# Patient Record
Sex: Female | Born: 2000 | Race: Black or African American | Hispanic: No | Marital: Single | Smoking: Never smoker
Health system: Southern US, Community
[De-identification: ages and names within clinical notes are randomized; demographics above are authoritative.]

---
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
## 2018-01-01 LAB — URINE CULTURE

## 2018-01-02 ENCOUNTER — Telehealth: Payer: Self-pay | Admitting: Emergency Medicine

## 2018-01-02 NOTE — Telephone Encounter (Signed)
Post ED Visit - Positive Culture Follow-up: Successful Patient Follow-Up  Culture assessed and recommendations reviewed by:  []  Elenor Quinones, Pharm.D. []  Heide Guile, Pharm.D., BCPS AQ-ID []  Parks Neptune, Pharm.D., BCPS []  Alycia Rossetti, Pharm.D., BCPS []  [place], Pharm.D., BCPS, AAHIVP []  Legrand Como, Pharm.D., BCPS, AAHIVP []  Salome Arnt, PharmD, BCPS []  Johnnette Gourd, PharmD, BCPS []  Hughes Better, PharmD, BCPS []  Leeroy Cha, PharmD  Positive urine culture  []  Patient discharged without antimicrobial prescription and treatment is now indicated [x]  Organism is resistant to prescribed ED discharge antimicrobial []  Patient with positive blood cultures  Changes discussed with ED provider: Nuala Alpha PharmD New antibiotic prescription stop cephalexin, start nitrofurantoin 100mg  po bid x 5 days  Attempting to contact patient   Hazle Nordmann 01/02/2018, 11:54 AM

## 2018-01-02 NOTE — Progress Notes (Signed)
ED Antimicrobial Stewardship Positive Culture Follow Up   Linda Johnston is an 17 y.o. female who presented to [facility] on 12/28/2017 with a chief complaint of right-sided abdominal pain, hematuria, and dysuria. Denies fever, chills, nausea, and vomiting.   Chief Complaint  Patient presents with  . Dysuria  . Abdominal Pain    Recent Results (from the past 720 hour(s))  Urine Culture     Status: Abnormal   Collection Time: 12/28/17  6:40 PM  Result Value Ref Range Status   Specimen Description URINE, RANDOM  Final   Special Requests   Final    NONE Performed at [facility], [address] [place], [place] [address]    Culture >=100,000 COLONIES/mL STAPHYLOCOCCUS SAPROPHYTICUS (A)  Final   Report Status 01/01/2018 FINAL  Final   Organism ID, Bacteria STAPHYLOCOCCUS SAPROPHYTICUS (A)  Final      Susceptibility   Staphylococcus saprophyticus - MIC*    CIPROFLOXACIN <=0.5 SENSITIVE Sensitive     GENTAMICIN <=0.5 SENSITIVE Sensitive     NITROFURANTOIN <=16 SENSITIVE Sensitive     OXACILLIN 2 RESISTANT Resistant     TETRACYCLINE <=1 SENSITIVE Sensitive     VANCOMYCIN 1 SENSITIVE Sensitive     TRIMETH/SULFA <=10 SENSITIVE Sensitive     CLINDAMYCIN RESISTANT Resistant     RIFAMPIN <=0.5 SENSITIVE Sensitive     Inducible Clindamycin POSITIVE Resistant     * >=100,000 COLONIES/mL STAPHYLOCOCCUS SAPROPHYTICUS  Wet prep, genital     Status: Abnormal   Collection Time: 12/28/17  6:55 PM  Result Value Ref Range Status   Yeast Wet Prep HPF POC NONE SEEN NONE SEEN Final   Trich, Wet Prep NONE SEEN NONE SEEN Final   Clue Cells Wet Prep HPF POC NONE SEEN NONE SEEN Final   WBC, Wet Prep HPF POC MODERATE (A) NONE SEEN Final   Sperm NONE SEEN  Final    Comment: Performed at [facility], [place] [address], [place], [place] [address]   [x]  Treated with cephalexin 500 mg TID x 7 days, organism resistant to prescribed antimicrobial []  Patient discharged originally without  antimicrobial agent and treatment is now indicated  New antibiotic prescription: nitrofurantoin 100 mg BID x 5 days  ED Provider: Nuala Alpha, PA-C  Ronna Polio 01/02/2018, 10:27 AM Clinical Pharmacist Monday - Friday phone -  [phone] Saturday - Sunday phone - [phone]

## 2018-01-05 ENCOUNTER — Emergency Department (HOSPITAL_COMMUNITY)
Admission: EM | Admit: 2018-01-05 | Discharge: 2018-01-06 | Disposition: A | Discharge status: Home / Self Care | Payer: Medicaid Other | Attending: Emergency Medicine | Admitting: Emergency Medicine

## 2018-01-05 ENCOUNTER — Encounter (HOSPITAL_COMMUNITY): Payer: Self-pay | Admitting: Emergency Medicine

## 2018-01-05 DIAGNOSIS — R309 Painful micturition, unspecified: Secondary | ICD-10-CM | POA: Diagnosis not present

## 2018-01-05 DIAGNOSIS — R350 Frequency of micturition: Secondary | ICD-10-CM | POA: Diagnosis not present

## 2018-01-05 DIAGNOSIS — N3000 Acute cystitis without hematuria: Secondary | ICD-10-CM | POA: Diagnosis not present

## 2018-01-05 DIAGNOSIS — R1030 Lower abdominal pain, unspecified: Secondary | ICD-10-CM | POA: Diagnosis present

## 2018-01-05 LAB — CBC
HEMATOCRIT: 29.5 % — AB (ref 36.0–46.0)
Hemoglobin: 8 g/dL — ABNORMAL LOW (ref 12.0–15.0)
MCH: 16.5 pg — AB (ref 26.0–34.0)
MCHC: 27.1 g/dL — ABNORMAL LOW (ref 30.0–36.0)
MCV: 61 fL — AB (ref 80.0–100.0)
NRBC: 0 % (ref 0.0–0.2)
Platelets: 577 10*3/uL — ABNORMAL HIGH (ref 150–400)
RBC: 4.84 MIL/uL (ref 3.87–5.11)
RDW: 21 % — ABNORMAL HIGH (ref 11.5–15.5)
WBC: 6.5 10*3/uL (ref 4.0–10.5)

## 2018-01-05 LAB — COMPREHENSIVE METABOLIC PANEL
ALBUMIN: 3.5 g/dL (ref 3.5–5.0)
ALK PHOS: 57 U/L (ref 38–126)
ALT: 10 U/L (ref 0–44)
ANION GAP: 8 (ref 5–15)
AST: 18 U/L (ref 15–41)
BUN: 5 mg/dL — ABNORMAL LOW (ref 6–20)
CALCIUM: 9.2 mg/dL (ref 8.9–10.3)
CHLORIDE: 102 mmol/L (ref 98–111)
CO2: 26 mmol/L (ref 22–32)
Creatinine, Ser: 0.88 mg/dL (ref 0.44–1.00)
GFR calc non Af Amer: >60 mL/min (ref >60)
GLUCOSE: 95 mg/dL (ref 70–99)
Potassium: 3.6 mmol/L (ref 3.5–5.1)
SODIUM: 136 mmol/L (ref 135–145)
Total Bilirubin: 0.3 mg/dL (ref 0.3–1.2)
Total Protein: 8.2 g/dL — ABNORMAL HIGH (ref 6.5–8.1)

## 2018-01-05 LAB — URINALYSIS, ROUTINE W REFLEX MICROSCOPIC
BILIRUBIN URINE: NEGATIVE
Glucose, UA: NEGATIVE mg/dL
HGB URINE DIPSTICK: NEGATIVE
Ketones, ur: NEGATIVE mg/dL
Nitrite: NEGATIVE
PH: 5 (ref 5.0–8.0)
Protein, ur: 100 mg/dL — AB
Specific Gravity, Urine: 1.03 (ref 1.005–1.030)

## 2018-01-05 LAB — LIPASE, BLOOD: LIPASE: 42 U/L (ref 11–51)

## 2018-01-05 LAB — I-STAT BETA HCG BLOOD, ED (MC, WL, AP ONLY): I-stat hCG, quantitative: <5 m[IU]/mL (ref <5)

## 2018-01-05 NOTE — ED Triage Notes (Signed)
Pt reports she is being taking the medications she was given for UTI however she continues to have symptoms and pain.  C/O RLQ pain w/ burning w/ urination.  Denies N/V/D/fevers or chills.

## 2018-01-05 NOTE — ED Provider Notes (Signed)
[facility] EMERGENCY DEPARTMENT Provider Note   CSN: [id number] Arrival date & time: 01/05/18  2115     History   Chief Complaint Chief Complaint  Patient presents with  . Urinary Tract Infection    HPI Linda Johnston is a 17 y.o. female.  Patient returns with persistent lower abdominal discomfort and cramping, frequent urination, pain with urination.  Patient was seen a week ago and diagnosed with urinary tract infection.  She has taken the antibiotic but has not helped.  Patient denies fever, nausea, vomiting, back pain.     History reviewed. No pertinent past medical history.  Patient Active Problem List   Diagnosis Date Noted  . Seizure ([place]) 01/06/2013  . Headache 01/06/2013  . Emesis 01/06/2013  . 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 No pertinent surgical history.   OB History   None      Home Medications    Prior to Admission medications   Medication Sig Start Date End Date Taking? Authorizing Provider  cephALEXin (KEFLEX) 500 MG capsule Take 1 capsule (500 mg total) by mouth 3 (three) times daily. 12/28/17   Domenic Moras, PA-C  nitrofurantoin, macrocrystal-monohydrate, (MACROBID) 100 MG capsule Take 1 capsule (100 mg total) by mouth 2 (two) times daily. X 7 days 01/06/18   Orpah Greek, MD  phenazopyridine (PYRIDIUM) 200 MG tablet Take 1 tablet (200 mg total) by mouth 3 (three) times daily as needed for pain. 01/06/18   Orpah Greek, MD  traMADol (ULTRAM) 50 MG tablet Take 1 tablet (50 mg total) by mouth every 6 (six) hours as needed. 01/06/18   Orpah Greek, MD    Family History No family history on file.  Social History Social History   Tobacco Use  . Smoking status: Never Smoker  . Smokeless tobacco: Never Used  . Tobacco comment: Per Guardian-no one smokes around patient  Substance Use Topics  . Alcohol use: No  . Drug use: Not on file     Allergies   Patient has no known  allergies.   Review of Systems Review of Systems  Gastrointestinal: Positive for abdominal pain.  Genitourinary: Positive for dysuria and frequency.  All other systems reviewed and are negative.    Physical Exam Updated Vital Signs BP 91/76   Pulse 62   Temp 98 F (36.7 C) (Oral)   Resp 16   Ht 5\' 9"  (1.753 m)   LMP 12/26/2017 (Exact Date)   SpO2 100%   Physical Exam  Constitutional: She is oriented to person, place, and time. She appears well-developed and well-nourished. No distress.  HENT:  Head: Normocephalic and atraumatic.  Right Ear: Hearing normal.  Left Ear: Hearing normal.  Nose: Nose normal.  Mouth/Throat: Oropharynx is clear and moist and mucous membranes are normal.  Eyes: Pupils are equal, round, and reactive to light. Conjunctivae and EOM are normal.  Neck: Normal range of motion. Neck supple.  Cardiovascular: Regular rhythm, S1 normal and S2 normal. Exam reveals no gallop and no friction rub.  No murmur heard. Pulmonary/Chest: Effort normal and breath sounds normal. No respiratory distress. She exhibits no tenderness.  Abdominal: Soft. Normal appearance and bowel sounds are normal. There is no hepatosplenomegaly. There is tenderness in the suprapubic area. There is no rebound, no guarding, no CVA tenderness, no tenderness at McBurney's point and negative Murphy's sign. No hernia.  Musculoskeletal: Normal range of motion.  Neurological: She is alert and oriented to person, place, and time. She has normal  strength. No cranial nerve deficit or sensory deficit. Coordination normal. GCS eye subscore is 4. GCS verbal subscore is 5. GCS motor subscore is 6.  Skin: Skin is warm, dry and intact. No rash noted. No cyanosis.  Psychiatric: She has a normal mood and affect. Her speech is normal and behavior is normal. Thought content normal.  Nursing note and vitals reviewed.    ED Treatments / Results  Labs (all labs ordered are listed, but only abnormal results are  displayed) Labs Reviewed  COMPREHENSIVE METABOLIC PANEL - Abnormal; Notable for the following components:      Result Value   BUN 5 (*)    Total Protein 8.2 (*)    All other components within normal limits  CBC - Abnormal; Notable for the following components:   Hemoglobin 8.0 (*)    HCT 29.5 (*)    MCV 61.0 (*)    MCH 16.5 (*)    MCHC 27.1 (*)    RDW 21.0 (*)    Platelets 577 (*)    All other components within normal limits  URINALYSIS, ROUTINE W REFLEX MICROSCOPIC - Abnormal; Notable for the following components:   APPearance TURBID (*)    Protein, ur 100 (*)    Leukocytes, UA LARGE (*)    Bacteria, UA RARE (*)    Non Squamous Epithelial 0-5 (*)    All other components within normal limits  LIPASE, BLOOD  I-STAT BETA HCG BLOOD, ED ([facility], [facility], [facility] ONLY)    EKG None  Radiology No results found.  Procedures Procedures (including critical care time)  Medications Ordered in ED Medications  phenazopyridine (PYRIDIUM) tablet 200 mg (has no administration in time range)  nitrofurantoin (macrocrystal-monohydrate) (MACROBID) capsule 100 mg (has no administration in time range)     Initial Impression / Assessment and Plan / ED Course  I have reviewed the triage vital signs and the nursing notes.  Pertinent labs & imaging results that were available during my care of the patient were reviewed by me and considered in my medical decision making (see chart for details).     Patient with persistent urinary tract infection symptoms despite taking Keflex for a week.  Records reviewed, urine culture from 1 week ago was positive for staph saprophyticus.  This was unlikely to respond to the Keflex that was prescribed.  She does not have any signs of pyelonephritis, sepsis or worsening infection.  Lab work is reassuring.  Abdominal exam reveals suprapubic tenderness without guarding or rebound.  Will initiate Macrobid twice daily for 1 week.  Final Clinical Impressions(s) / ED Diagnoses     Final diagnoses:  Acute cystitis without hematuria    ED Discharge Orders         Ordered    nitrofurantoin, macrocrystal-monohydrate, (MACROBID) 100 MG capsule  2 times daily     01/06/18 0006    phenazopyridine (PYRIDIUM) 200 MG tablet  3 times daily PRN     01/06/18 0006    traMADol (ULTRAM) 50 MG tablet  Every 6 hours PRN     01/06/18 0006           Orpah Greek, MD 01/06/18 0006

## 2018-01-06 MED ORDER — PHENAZOPYRIDINE HCL 100 MG PO TABS
200.0000 mg | ORAL_TABLET | Freq: Once | ORAL | Status: AC
  Administered 2018-01-06: 200 mg via ORAL
  Filled 2018-01-06: qty 2

## 2018-01-06 MED ORDER — NITROFURANTOIN MONOHYD MACRO 100 MG PO CAPS: 100.0000 mg | ORAL_CAPSULE | Freq: Two times a day (BID) | ORAL | 0 refills | Status: DC

## 2018-01-06 MED ORDER — NITROFURANTOIN MONOHYD MACRO 100 MG PO CAPS
100.0000 mg | ORAL_CAPSULE | Freq: Once | ORAL | Status: AC
  Administered 2018-01-06: 100 mg via ORAL
  Filled 2018-01-06: qty 1

## 2018-01-06 MED ORDER — TRAMADOL HCL 50 MG PO TABS: 50.0000 mg | ORAL_TABLET | Freq: Four times a day (QID) | ORAL | 0 refills | Status: DC | PRN

## 2018-01-06 MED ORDER — PHENAZOPYRIDINE HCL 200 MG PO TABS: 200.0000 mg | ORAL_TABLET | Freq: Three times a day (TID) | ORAL | 0 refills | Status: DC | PRN

## 2018-01-09 ENCOUNTER — Ambulatory Visit (INDEPENDENT_AMBULATORY_CARE_PROVIDER_SITE_OTHER): Payer: Medicaid Other | Admitting: Family Medicine

## 2018-01-09 ENCOUNTER — Ambulatory Visit: Payer: Medicaid Other | Attending: Family Medicine

## 2018-01-09 ENCOUNTER — Encounter: Payer: Self-pay | Admitting: Family Medicine

## 2018-01-09 VITALS — BP 99/71 | HR 85 | Temp 98.3°F | Resp 17 | Ht 65.0 in | Wt 120.8 lb

## 2018-01-09 DIAGNOSIS — D649 Anemia, unspecified: Secondary | ICD-10-CM

## 2018-01-09 DIAGNOSIS — N926 Irregular menstruation, unspecified: Secondary | ICD-10-CM

## 2018-01-09 DIAGNOSIS — N390 Urinary tract infection, site not specified: Secondary | ICD-10-CM | POA: Insufficient documentation

## 2018-01-09 NOTE — Progress Notes (Signed)
Linda Johnston, is a 17 y.o. female  DJS:[id number]  YIF:[id number]  DOB - 12-22-2000  CC: No chief complaint on file.      HPI: Linda Johnston is a 17 y.o. female is here today to establish care.   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   Today's visit: ER follow-up UTI  Linda Johnston was seen at [facility] ER initially on 12/28/2017 and diagnosed and treated for pyelonephritis Keflex. She represented the ER on 01/05/2018 with persistent unimproved urinary symptoms and suprapubic tenderness. Urine culture revealed UTI secondary to staph saprophytius. Keflex was discontinued and Macrobid was initiated along with pain medication. CBC significant for anemia with a hemoglobin 8 and thrombocytosis (plt 577). STD test performed in ER were negative. She was referred her to establish care today. Symptoms of UTI have now resolved.  Denies fever, chills, abdominal pain, flank pain, nausea or vomiting.  Anemia No known history of anemia. She reports her last menstrual period came 2 weeks after she had completed the previous cycles. Menstrual cycle was very heavy, experienced cramps, with N&V. Reports her normal cycle typically doesn't cause illness. No history of sickle cell anemia., fibroids, or irregular periods. Has been sexually active, although not recently. Recent urine pregnancy was negative. Denies new headaches, chest pain,  numbness or tingling, bruising or SOB.  Current medications: Current Outpatient Medications:  .  nitrofurantoin, macrocrystal-monohydrate, (MACROBID) 100 MG capsule, Take 1 capsule (100 mg total) by mouth 2 (two) times daily. X 7 days, Disp: 14 capsule, Rfl: 0 .  phenazopyridine (PYRIDIUM) 200 MG tablet, Take 1 tablet (200 mg total) by mouth 3 (three) times daily as needed for pain., Disp: 6 tablet, Rfl: 0 .  traMADol (ULTRAM) 50 MG tablet, Take 1 tablet (50 mg total) by mouth every 6 (six) hours as needed., Disp: 15  tablet, Rfl: 0   Pertinent family medical history: No known family of cardiovascular disease, diabetes, blood disorders, or lung disease.  No Known Allergies  Social History   Socioeconomic History  . Marital status: Single    Spouse name: Not on file  . Number of children: Not on file  . Years of education: Not on file  . Highest education level: Not on file  Occupational History  . Not on file  Social Needs  . Financial resource strain: Not on file  . Food insecurity:    Worry: Not on file    Inability: Not on file  . Transportation needs:    Medical: Not on file    Non-medical: Not on file  Tobacco Use  . Smoking status: Never Smoker  . Smokeless tobacco: Never Used  . Tobacco comment: Per Guardian-no one smokes around patient  Substance and Sexual Activity  . Alcohol use: No  . Drug use: Not on file  . Sexual activity: Never  Lifestyle  . Physical activity:    Days per week: Not on file    Minutes per session: Not on file  . Stress: Not on file  Relationships  . Social connections:    Talks on phone: Not on file    Gets together: Not on file    Attends religious service: Not on file    Active member of club or organization: Not on file    Attends meetings of clubs or organizations: Not on file    Relationship status: Not on file  . Intimate partner violence:    Fear of current or ex partner: Not on file  Emotionally abused: Not on file    Physically abused: Not on file    Forced sexual activity: Not on file  Other Topics Concern  . Not on file  Social History Narrative   Pt from [place]. Living in [place] with [place] to attend school here. They are unaware of medical history or family history.     Review of Systems: Constitutional: Negative for fever, chills, diaphoresis, activity change, appetite change and fatigue. Respiratory: Negative for cough, choking, chest tightness, shortness of breath, wheezing and stridor.  Cardiovascular: Negative for  chest pain, palpitations and leg swelling. Gastrointestinal: Negative for abdominal distention. Genitourinary: Negative for dysuria, urgency, frequency, hematuria, flank pain, decreased urine volume, difficulty urinating. Neurological: Negative for dizziness, tremors, seizures, syncope, facial asymmetry, speech difficulty, weakness, light-headedness, numbness and headaches.  Hematological: Negative for adenopathy. Does not bruise/bleed easily. Psychiatric/Behavioral: Negative for hallucinations, behavioral problems, confusion, dysphoric mood, decreased concentration and agitation. Objective:   Vitals:   01/09/18 0930  BP: 99/71  Pulse: 85  Resp: 17  Temp: 98.3 F (36.8 C)  SpO2: 99%    BP Readings from Last 3 Encounters:  01/06/18 91/76  12/28/17 96/67  04/03/16 109/65    Filed Weights   01/09/18 0930  Weight: 120 lb 12.8 oz (54.8 kg)      Physical Exam: Constitutional: Patient appears well-developed and well-nourished. No distress. Neck: Normal ROM. Neck supple. No JVD. No tracheal deviation. No thyromegaly. CVS: RRR, S1/S2 +, no murmurs, no gallops, no carotid bruit.  Pulmonary: Effort and breath sounds normal, no stridor, rhonchi, wheezes, rales.  Abdominal: Soft. BS +, no distension, tenderness, rebound or guarding. No CVA tenderness. Musculoskeletal: Normal range of motion. No edema and no tenderness.  Neuro: Alert. Normal muscle tone coordination. Normal gait. No cranial nerve deficit. Skin: Skin is warm and dry. No rash noted. Not diaphoretic. No erythema. No pallor. Psychiatric: Normal mood and affect. Behavior, judgment, thought content normal.  Lab Results (prior encounters)  Lab Results  Component Value Date   WBC 6.5 01/05/2018   HGB 8.0 (L) 01/05/2018   HCT 29.5 (L) 01/05/2018   MCV 61.0 (L) 01/05/2018   PLT 577 (H) 01/05/2018   Lab Results  Component Value Date   CREATININE 0.88 01/05/2018   BUN 5 (L) 01/05/2018   NA 136 01/05/2018   K 3.6  01/05/2018   CL 102 01/05/2018   CO2 26 01/05/2018  2     Assessment and plan:  1. Irregular menstrual cycle, will continue to monitor for now as she has only experienced one episode of abnormal bleeding. Will check HCG quantitative to rule out possible recent hx of pregnancy give the associated symptoms of N&V and the irregularity of the bleeding.  2. Anemia, unspecified type, recent hemoglobin 8. One episode of heavy bleeding with an abnormally early, heavy,  menstrual cycle. Checking  Iron, TIBC and Ferritin Panel,  CBC with Differential, Beta hCG quant (ref lab); Future  3. Acute UTI (urinary tract infection) - Urine Culture, to ensure bacteria is not continuing to grow in urine. Continue and complete all antibiotics   Return in about 6 weeks (around 02/20/2018) for Anemia .  Orders Placed This Encounter  Procedures  . Urine Culture    Standing Status:   Future    Number of Occurrences:   1    Standing Expiration Date:   01/10/2019  . Iron, TIBC and Ferritin Panel    Standing Status:   Future    Number of Occurrences:  1    Standing Expiration Date:   01/10/2019  . CBC with Differential    Standing Status:   Future    Number of Occurrences:   1    Standing Expiration Date:   01/10/2019  . Beta hCG quant (ref lab)    Standing Status:   Future    Number of Occurrences:   1    Standing Expiration Date:   01/10/2019    The patient was given clear instructions to go to ER or return to medical center if symptoms don't improve, worsen or new problems develop. The patient verbalized understanding. The patient was advised  to call and obtain lab results if they haven't heard anything from out office within 7-10 business days.  Molli Barrows, FNP Primary Care at [facility] [address], [place] [phone]: [phone]   A total of 30 minutes spent, greater than 50 % of this time was spent counseling and coordination of care.    This  note has been created with Surveyor, quantity. Any transcriptional errors are unintentional.

## 2018-01-09 NOTE — Patient Instructions (Addendum)
To have your labs completed go to: [facility], [place], [place] [address] [phone]   Thank you for choosing Primary Care at [facility] for your medical home!    Linda Johnston was seen by Molli Barrows, FNP today.   Linda Johnston's primary care provider is Scot Jun, FNP.   For the best care possible,  you should try to see Molli Barrows, FNP henever you come to clinic.   We look forward to seeing you again soon!  If you have any questions about your visit today,  please call us at   Or feel free to reach your provider via [place].      Iron Deficiency Anemia, Adult Iron-deficiency anemia is when you have a low amount of red blood cells or hemoglobin. This happens because you have too little iron in your body. Hemoglobin carries oxygen to parts of the body. Anemia can cause your body to not get enough oxygen. It may or may not cause symptoms. Follow these instructions at home: Medicines  Take over-the-counter and prescription medicines only as told by your doctor. This includes iron pills (supplements) and vitamins.  If you cannot handle taking iron pills by mouth, ask your doctor about getting iron through: ? A vein (intravenously). ? A shot (injection) into a muscle.  Take iron pills when your stomach is empty. If you cannot handle this, take them with food.  Do not drink milk or take antacids at the same time as your iron pills.  To prevent trouble pooping (constipation), eat fiber or take medicine (stool softener) as told by your doctor. Eating and drinking  Talk with your doctor before changing the foods you eat. He or she may tell you to eat foods that have a lot of iron, such as: ? Liver. ? Lowfat (lean) beef. ? Breads and cereals that have iron added to them (fortified breads and cereals). ? Eggs. ? Dried fruit. ? Dark green, leafy vegetables.  Drink enough fluid to keep your pee (urine) clear or  pale yellow.  Eat fresh fruits and vegetables that are high in vitamin C. They help your body to use iron. Foods with a lot of vitamin C include: ? Oranges. ? Peppers. ? Tomatoes. ? Mangoes. General instructions  Return to your normal activities as told by your doctor. Ask your doctor what activities are safe for you.  Keep yourself clean, and keep things clean around you (your surroundings). Anemia can make you get sick more easily.  Keep all follow-up visits as told by your doctor. This is important. Contact a doctor if:  You feel sick to your stomach (nauseous).  You throw up (vomit).  You feel weak.  You are sweating for no clear reason.  You have trouble pooping, such as: ? Pooping (having a bowel movement) less than 3 times a week. ? Straining to poop. ? Having poop that is hard, dry, or larger than normal. ? Feeling full or bloated. ? Pain in the lower belly. ? Not feeling better after pooping. Get help right away if:  You pass out (faint). If this happens, do not drive yourself to the hospital. Call your local emergency services (911 in the [place]  You have chest pain.  You have shortness of breath that: ? Is very bad. ? Gets worse with physical activity.  You have a fast heartbeat.  You get light-headed when getting up from sitting or lying down. This information is not intended  to replace advice given to you by your health care provider. Make sure you discuss any questions you have with your health care provider. Document Released: 04/16/2010 Document Revised: 12/02/2015 Document Reviewed: 12/02/2015 Elsevier Interactive Patient Education  2017 Elsevier Inc.  Iron-Rich Diet Iron is a mineral that helps your body to produce hemoglobin. Hemoglobin is a protein in your red blood cells that carries oxygen to your body's tissues. Eating too little iron may cause you to feel weak and tired, and it can increase your risk for infection. Eating enough iron is  necessary for your body's metabolism, muscle function, and nervous system. Iron is naturally found in many foods. It can also be added to foods or fortified in foods. There are two types of dietary iron:  Heme iron. Heme iron is absorbed by the body more easily than nonheme iron. Heme iron is found in meat, poultry, and fish.  Nonheme iron. Nonheme iron is found in dietary supplements, iron-fortified grains, beans, and vegetables.  You may need to follow an iron-rich diet if:  You have been diagnosed with iron deficiency or iron-deficiency anemia.  You have a condition that prevents you from absorbing dietary iron, such as: ? Infection in your intestines. ? Celiac disease. This involves long-lasting (chronic) inflammation of your intestines.  You do not eat enough iron.  You eat a diet that is high in foods that impair iron absorption.  You have lost a lot of blood.  You have heavy bleeding during your menstrual cycle.  You are pregnant.  What is my plan? Your health care provider may help you to determine how much iron you need per day based on your condition. Generally, when a person consumes sufficient amounts of iron in the diet, the following iron needs are met:  Men. ? 50-55 years old: 11 mg per day. ? 51-74 years old: 8 mg per day.  Women. ? 12-73 years old: 15 mg per day. ? 56-48 years old: 18 mg per day. ? Over 80 years old: 8 mg per day. ? Pregnant women: 27 mg per day. ? Breastfeeding women: 9 mg per day.  What do I need to know about an iron-rich diet?  Eat fresh fruits and vegetables that are high in vitamin C along with foods that are high in iron. This will help increase the amount of iron that your body absorbs from food, especially with foods containing nonheme iron. Foods that are high in vitamin C include oranges, peppers, tomatoes, and mango.  Take iron supplements only as directed by your health care provider. Overdose of iron can be life-threatening.  If you were prescribed iron supplements, take them with orange juice or a vitamin C supplement.  Cook foods in pots and pans that are made from iron.  Eat nonheme iron-containing foods alongside foods that are high in heme iron. This helps to improve your iron absorption.  Certain foods and drinks contain compounds that impair iron absorption. Avoid eating these foods in the same meal as iron-rich foods or with iron supplements. These include: ? Coffee, black tea, and red wine. ? Milk, dairy products, and foods that are high in calcium. ? Beans, soybeans, and peas. ? Whole grains.  When eating foods that contain both nonheme iron and compounds that impair iron absorption, follow these tips to absorb iron better. ? Soak beans overnight before cooking. ? Soak whole grains overnight and drain them before using. ? Ferment flours before baking, such as using yeast in bread dough.  What foods can I eat? Grains Iron-fortified breakfast cereal. Iron-fortified whole-wheat bread. Enriched rice. Sprouted grains. Vegetables Spinach. Potatoes with skin. Green peas. Broccoli. Red and green bell peppers. Fermented vegetables. Fruits Prunes. Raisins. Oranges. Strawberries. Mango. Grapefruit. Meats and Other Protein Sources Beef liver. Oysters. Beef. Shrimp. [place]. Chicken. [place]. Sardines. Chickpeas. Nuts. Tofu. Beverages Tomato juice. Fresh orange juice. Prune juice. Hibiscus tea. Fortified instant breakfast shakes. Condiments Tahini. Fermented soy sauce. Sweets and Desserts Black-strap molasses. Other Wheat germ. The items listed above may not be a complete list of recommended foods or beverages. Contact your dietitian for more options. What foods are not recommended? Grains Whole grains. Bran cereal. Bran flour. Oats. Vegetables Artichokes. Brussels sprouts. Kale. Fruits Blueberries. Raspberries. Strawberries. Figs. Meats and Other Protein Sources Soybeans. Products made from soy  protein. Dairy Milk. Cream. Cheese. Yogurt. Cottage cheese. Beverages Coffee. Black tea. Red wine. Sweets and Desserts Cocoa. Chocolate. Ice cream. Other Basil. Oregano. Parsley. The items listed above may not be a complete list of foods and beverages to avoid. Contact your dietitian for more information. This information is not intended to replace advice given to you by your health care provider. Make sure you discuss any questions you have with your health care provider. Document Released: 10/26/2004 Document Revised: 10/02/2015 Document Reviewed: 10/09/2013 Elsevier Interactive Patient Education  [employer].

## 2018-01-10 ENCOUNTER — Telehealth: Payer: Self-pay | Admitting: Family Medicine

## 2018-01-10 ENCOUNTER — Other Ambulatory Visit (HOSPITAL_COMMUNITY): Payer: Self-pay | Admitting: Family Medicine

## 2018-01-10 DIAGNOSIS — D75839 Thrombocytosis, unspecified: Secondary | ICD-10-CM

## 2018-01-10 DIAGNOSIS — D473 Essential (hemorrhagic) thrombocythemia: Secondary | ICD-10-CM

## 2018-01-10 DIAGNOSIS — D649 Anemia, unspecified: Secondary | ICD-10-CM

## 2018-01-10 DIAGNOSIS — D509 Iron deficiency anemia, unspecified: Secondary | ICD-10-CM

## 2018-01-10 LAB — CBC WITH DIFFERENTIAL/PLATELET
BASOS ABS: 0 10*3/uL (ref 0.0–0.2)
BASOS: 1 %
EOS (ABSOLUTE): 0.1 10*3/uL (ref 0.0–0.4)
Eos: 2 %
HEMOGLOBIN: 8.4 g/dL — AB (ref 11.1–15.9)
Hematocrit: 31.2 % — ABNORMAL LOW (ref 34.0–46.6)
Immature Grans (Abs): 0 10*3/uL (ref 0.0–0.1)
Immature Granulocytes: 0 %
LYMPHS ABS: 1.4 10*3/uL (ref 0.7–3.1)
Lymphs: 27 %
MCH: 16.7 pg — AB (ref 26.6–33.0)
MCHC: 26.9 g/dL — AB (ref 31.5–35.7)
MCV: 62 fL — AB (ref 79–97)
MONOCYTES: 9 %
Monocytes Absolute: 0.5 10*3/uL (ref 0.1–0.9)
NEUTROS ABS: 3.2 10*3/uL (ref 1.4–7.0)
Neutrophils: 61 %
Platelets: 623 10*3/uL — ABNORMAL HIGH (ref 150–450)
RBC: 5.03 x10E6/uL (ref 3.77–5.28)
RDW: 20.6 % — ABNORMAL HIGH (ref 12.3–15.4)
WBC: 5.1 10*3/uL (ref 3.4–10.8)

## 2018-01-10 LAB — IRON,TIBC AND FERRITIN PANEL
FERRITIN: 10 ng/mL — AB (ref 15–77)
IRON SATURATION: 5 % — AB (ref 15–55)
Iron: 19 ug/dL — ABNORMAL LOW (ref 27–159)
Total Iron Binding Capacity: 402 ug/dL (ref 250–450)
UIBC: 383 ug/dL (ref 131–425)

## 2018-01-10 LAB — BETA HCG QUANT (REF LAB): hCG Quant: <1 m[IU]/mL

## 2018-01-10 NOTE — Telephone Encounter (Signed)
Contacted patient regarding labs. Hemoglobin and iron level remains low. Will schedule an iron infusion of Ferriheme in efforts to improve her iron level.  Given patient low hemoglobin and abnormally elevated platelet count, without any specific known source of bleeding,  will refer to hematology. For further work-up and evaluation.   Scheduled patient for an iron infusion with ferriheme 01/11/2018 @ 9 am at the [facility]. Patient notified via phone.

## 2018-01-10 NOTE — Progress Notes (Signed)
Orders placed for feraheme infusion

## 2018-01-10 NOTE — Addendum Note (Signed)
Addended by: Scot Jun on: 01/10/2018 12:01 PM   Modules accepted: Orders

## 2018-01-11 ENCOUNTER — Ambulatory Visit (INDEPENDENT_AMBULATORY_CARE_PROVIDER_SITE_OTHER): Payer: Medicaid Other | Admitting: Family Medicine

## 2018-01-11 ENCOUNTER — Ambulatory Visit (HOSPITAL_COMMUNITY)
Admission: RE | Admit: 2018-01-11 | Discharge: 2018-01-11 | Disposition: A | Discharge status: Home / Self Care | Payer: Medicaid Other | Source: Ambulatory Visit | Attending: Family Medicine | Admitting: Family Medicine

## 2018-01-11 VITALS — BP 97/61 | HR 95 | Temp 98.1°F | Resp 16 | Ht 65.0 in | Wt 120.8 lb

## 2018-01-11 DIAGNOSIS — Z30013 Encounter for initial prescription of injectable contraceptive: Secondary | ICD-10-CM | POA: Diagnosis not present

## 2018-01-11 DIAGNOSIS — D508 Other iron deficiency anemias: Secondary | ICD-10-CM | POA: Diagnosis not present

## 2018-01-11 DIAGNOSIS — D509 Iron deficiency anemia, unspecified: Secondary | ICD-10-CM | POA: Diagnosis present

## 2018-01-11 DIAGNOSIS — D649 Anemia, unspecified: Secondary | ICD-10-CM

## 2018-01-11 LAB — URINE CULTURE

## 2018-01-11 MED ORDER — SODIUM CHLORIDE 0.9 % IV SOLN
510.0000 mg | Freq: Once | INTRAVENOUS | Status: AC
  Administered 2018-01-11: 510 mg via INTRAVENOUS
  Filled 2018-01-11: qty 17

## 2018-01-11 MED ORDER — MEDROXYPROGESTERONE ACETATE 150 MG/ML IM SUSP
150.0000 mg | Freq: Once | INTRAMUSCULAR | Status: AC
  Administered 2018-01-11: 150 mg via INTRAMUSCULAR

## 2018-01-11 MED ORDER — SODIUM CHLORIDE 0.9 % IV SOLN
INTRAVENOUS | Status: DC | PRN
  Administered 2018-01-11: 250 mL via INTRAVENOUS

## 2018-01-11 MED ORDER — SODIUM CHLORIDE 0.9 % IV SOLN: INTRAVENOUS | Status: DC | PRN

## 2018-01-11 NOTE — Progress Notes (Signed)
PATIENT CARE CENTER NOTE  Diagnosis: Iron Deficiency Anemia    Provider: Molli Barrows, FNP   Procedure: IV Feraheme    Note: Patient received infusion of Feraheme. Monitored patient for 30 minutes post-infusion. Patient tolerated infusion well with no adverse reaction. Vital signs stable. Discharge instructions given. Patient alert, oriented and ambulatory at discharge.

## 2018-01-11 NOTE — Discharge Instructions (Signed)

## 2018-01-11 NOTE — Progress Notes (Signed)
Patient ID: Linda Johnston, female    DOB: 15-Sep-2000, 17 y.o.   MRN: [id number]  PCP: Scot Jun, FNP  Chief Complaint  Patient presents with  . Contraception    interested in Depo Shot. was on it before. has been off of it about a year & tolerated it well. did have heavy bleeding the first 2 months    Subjective:  HPI Linda Johnston is a 17 y.o. female presents for evaluation of birth control. Previously on depo injection. Last injection June 2018. Recent serum HCG 01/10/2018, was negative. She would like to resume depo-provera injections.  Social History   Socioeconomic History  . Marital status: Single    Spouse name: Not on file  . Number of children: Not on file  . Years of education: Not on file  . Highest education level: Not on file  Occupational History  . Not on file  Social Needs  . Financial resource strain: Not on file  . Food insecurity:    Worry: Not on file    Inability: Not on file  . Transportation needs:    Medical: Not on file    Non-medical: Not on file  Tobacco Use  . Smoking status: Never Smoker  . Smokeless tobacco: Never Used  . Tobacco comment: Per Guardian-no one smokes around patient  Substance and Sexual Activity  . Alcohol use: No  . Drug use: Not on file  . Sexual activity: Never  Lifestyle  . Physical activity:    Days per week: Not on file    Minutes per session: Not on file  . Stress: Not on file  Relationships  . Social connections:    Talks on phone: Not on file    Gets together: Not on file    Attends religious service: Not on file    Active member of club or organization: Not on file    Attends meetings of clubs or organizations: Not on file    Relationship status: Not on file  . Intimate partner violence:    Fear of current or ex partner: Not on file    Emotionally abused: Not on file    Physically abused: Not on file    Forced sexual activity: Not on file  Other Topics Concern  . Not on file  Social  History Narrative   Pt from [place]. Living in [place] with [place] to attend school here. They are unaware of medical history or family history.     No family history on file. Review of Systems  Pertinent negatives listed in HPI  Patient Active Problem List   Diagnosis Date Noted  . Seizure ([place]) 01/06/2013  . Headache 01/06/2013  . Emesis 01/06/2013  . Pseudoseizures 01/06/2013    No Known Allergies  Prior to Admission medications   Medication Sig Start Date End Date Taking? Authorizing Provider  nitrofurantoin, macrocrystal-monohydrate, (MACROBID) 100 MG capsule Take 1 capsule (100 mg total) by mouth 2 (two) times daily. X 7 days 01/06/18  Yes Pollina, Gwenyth Allegra, MD  traMADol (ULTRAM) 50 MG tablet Take 1 tablet (50 mg total) by mouth every 6 (six) hours as needed. 01/06/18  Yes Pollina, Gwenyth Allegra, MD  phenazopyridine (PYRIDIUM) 200 MG tablet Take 1 tablet (200 mg total) by mouth 3 (three) times daily as needed for pain. Patient not taking: Reported on 01/11/2018 01/06/18   Orpah Greek, MD    Past Medical, Surgical Family and Social History reviewed and updated.    Objective:  Today's Vitals   01/11/18 1434  BP: 97/61  Pulse: 95  Resp: 16  Temp: 98.1 F (36.7 C)  TempSrc: Oral  SpO2: 98%  Weight: 120 lb 12.8 oz (54.8 kg)  Height: 5\' 5"  (1.651 m)    Wt Readings from Last 3 Encounters:  01/11/18 120 lb 12.8 oz (54.8 kg) (36 %, Z= -0.35)*  01/09/18 120 lb 12.8 oz (54.8 kg) (36 %, Z= -0.35)*  04/03/16 137 lb (62.1 kg) (72 %, Z= 0.59)*   * Growth percentiles are based on CDC (Girls, 2-20 Years) data.     Physical Exam General appearance: alert, well developed, well nourished, cooperative and in no distress Head: Normocephalic, without obvious abnormality, atraumatic Respiratory: Respirations even and unlabored, normal respiratory rate Heart: rate and rhythm normal. No gallop or murmurs noted on exam  Extremities: No gross deformities Skin:  Skin color, texture, turgor normal. No rashes seen  Psych: Appropriate mood and affect. Neurologic: Mental status: Alert, oriented to person, place, and time, thought content appropriate.    Assessment & Plan:  1. Encounter for initial prescription of injectable contraceptive,  - medroxyPROGESTERone (DEPO-PROVERA) injection 150 mg, tolerated. -information provided to prevent STD, use condoms  2. Other iron deficiency anemia Received an iron infusion today. Recent hemoglobin 8.4. Appointment pending with hematology.  - CBC with Differential; Future and Iron, TIBC and Ferritin Panel; Future x 1 week    -The patient was given clear instructions to go to ER or return to medical center if symptoms do not improve, worsen or new problems develop. The patient verbalized understanding.    Molli Barrows, FNP Primary Care at [facility] [address] [place] [phone]: [phone]

## 2018-01-11 NOTE — Patient Instructions (Signed)
Go to [facility] to have labs drawn: [facility]  [place], [place], [place] [address] [phone]     Medroxyprogesterone injection [Contraceptive] What is this medicine? MEDROXYPROGESTERONE (me DROX ee proe JES te rone) contraceptive injections prevent pregnancy. They provide effective birth control for 3 months. Depo-subQ Provera 104 is also used for treating pain related to endometriosis. This medicine may be used for other purposes; ask your health care provider or pharmacist if you have questions. COMMON BRAND NAME(S): Depo-Provera, Depo-subQ Provera 104 What should I tell my health care provider before I take this medicine? They need to know if you have any of these conditions: -frequently drink alcohol -asthma -blood vessel disease or a history of a blood clot in the lungs or legs -bone disease such as osteoporosis -breast cancer -diabetes -eating disorder (anorexia nervosa or bulimia) -high blood pressure -HIV infection or AIDS -kidney disease -liver disease -mental depression -migraine -seizures (convulsions) -stroke -tobacco smoker -vaginal bleeding -an unusual or allergic reaction to medroxyprogesterone, other hormones, medicines, foods, dyes, or preservatives -pregnant or trying to get pregnant -breast-feeding How should I use this medicine? Depo-Provera Contraceptive injection is given into a muscle. Depo-subQ Provera 104 injection is given under the skin. These injections are given by a health care professional. You must not be pregnant before getting an injection. The injection is usually given during the first 5 days after the start of a menstrual period or 6 weeks after delivery of a baby. Talk to your pediatrician regarding the use of this medicine in children. Special care may be needed. These injections have been used in female children who have started having menstrual periods. Overdosage: If you think you have  taken too much of this medicine contact a poison control center or emergency room at once. NOTE: This medicine is only for you. Do not share this medicine with others. What if I miss a dose? Try not to miss a dose. You must get an injection once every 3 months to maintain birth control. If you cannot keep an appointment, call and reschedule it. If you wait longer than 13 weeks between Depo-Provera contraceptive injections or longer than 14 weeks between Depo-subQ Provera 104 injections, you could get pregnant. Use another method for birth control if you miss your appointment. You may also need a pregnancy test before receiving another injection. What may interact with this medicine? Do not take this medicine with any of the following medications: -bosentan This medicine may also interact with the following medications: -aminoglutethimide -antibiotics or medicines for infections, especially rifampin, rifabutin, rifapentine, and griseofulvin -aprepitant -barbiturate medicines such as phenobarbital or primidone -bexarotene -carbamazepine -medicines for seizures like ethotoin, felbamate, oxcarbazepine, phenytoin, topiramate -modafinil -St. John's wort This list may not describe all possible interactions. Give your health care provider a list of all the medicines, herbs, non-prescription drugs, or dietary supplements you use. Also tell them if you smoke, drink alcohol, or use illegal drugs. Some items may interact with your medicine. What should I watch for while using this medicine? This drug does not protect you against HIV infection (AIDS) or other sexually transmitted diseases. Use of this product may cause you to lose calcium from your bones. Loss of calcium may cause weak bones (osteoporosis). Only use this product for more than 2 years if other forms of birth control are not right for you. The longer you use this product for birth control the more likely you will be at risk for weak bones.  Ask  your health care professional how you can keep strong bones. You may have a change in bleeding pattern or irregular periods. Many females stop having periods while taking this drug. If you have received your injections on time, your chance of being pregnant is very low. If you think you may be pregnant, see your health care professional as soon as possible. Tell your health care professional if you want to get pregnant within the next year. The effect of this medicine may last a long time after you get your last injection. What side effects may I notice from receiving this medicine? Side effects that you should report to your doctor or health care professional as soon as possible: -allergic reactions like skin rash, itching or hives, swelling of the face, lips, or tongue -breast tenderness or discharge -breathing problems -changes in vision -depression -feeling faint or lightheaded, falls -fever -pain in the abdomen, chest, groin, or leg -problems with balance, talking, walking -unusually weak or tired -yellowing of the eyes or skin Side effects that usually do not require medical attention (report to your doctor or health care professional if they continue or are bothersome): -acne -fluid retention and swelling -headache -irregular periods, spotting, or absent periods -temporary pain, itching, or skin reaction at site where injected -weight gain This list may not describe all possible side effects. Call your doctor for medical advice about side effects. You may report side effects to FDA at 1-800-FDA-1088. Where should I keep my medicine? This does not apply. The injection will be given to you by a health care professional. NOTE: This sheet is a summary. It may not cover all possible information. If you have questions about this medicine, talk to your doctor, pharmacist, or health care provider.  2018 Elsevier/Gold Standard (2008-04-04 18:37:56)

## 2018-01-12 ENCOUNTER — Telehealth: Payer: Self-pay | Admitting: Hematology & Oncology

## 2018-01-12 NOTE — Telephone Encounter (Signed)
lmom for pt to return call to office to confirm new patient appt date/time. Appt letter mailed for appt 01/30/18 at noon

## 2018-01-18 ENCOUNTER — Ambulatory Visit: Payer: Medicaid Other | Attending: Family Medicine

## 2018-01-18 DIAGNOSIS — D508 Other iron deficiency anemias: Secondary | ICD-10-CM | POA: Diagnosis present

## 2018-01-19 LAB — IRON,TIBC AND FERRITIN PANEL
Ferritin: 253 ng/mL — ABNORMAL HIGH (ref 15–77)
IRON: 51 ug/dL (ref 27–159)
Iron Saturation: 13 % — ABNORMAL LOW (ref 15–55)
Total Iron Binding Capacity: 381 ug/dL (ref 250–450)
UIBC: 330 ug/dL (ref 131–425)

## 2018-01-19 LAB — CBC WITH DIFFERENTIAL/PLATELET
Basophils Absolute: 0 10*3/uL (ref 0.0–0.2)
Basos: 1 %
EOS (ABSOLUTE): 0.1 10*3/uL (ref 0.0–0.4)
EOS: 1 %
HEMOGLOBIN: 9.3 g/dL — AB (ref 11.1–15.9)
Hematocrit: 32.8 % — ABNORMAL LOW (ref 34.0–46.6)
IMMATURE GRANS (ABS): 0 10*3/uL (ref 0.0–0.1)
IMMATURE GRANULOCYTES: 1 %
LYMPHS: 20 %
Lymphocytes Absolute: 1.5 10*3/uL (ref 0.7–3.1)
MCH: 18.3 pg — ABNORMAL LOW (ref 26.6–33.0)
MCHC: 28.4 g/dL — AB (ref 31.5–35.7)
MCV: 65 fL — ABNORMAL LOW (ref 79–97)
MONOS ABS: 0.6 10*3/uL (ref 0.1–0.9)
Monocytes: 8 %
NEUTROS PCT: 69 %
Neutrophils Absolute: 5.1 10*3/uL (ref 1.4–7.0)
Platelets: 457 10*3/uL — ABNORMAL HIGH (ref 150–450)
RBC: 5.07 x10E6/uL (ref 3.77–5.28)
RDW: 27.6 % — ABNORMAL HIGH (ref 12.3–15.4)
WBC: 7.3 10*3/uL (ref 3.4–10.8)

## 2018-01-19 NOTE — Progress Notes (Signed)
Patient notified of results & recommendations. Expressed understanding.

## 2018-01-26 ENCOUNTER — Other Ambulatory Visit: Payer: Self-pay | Admitting: Hematology

## 2018-01-26 DIAGNOSIS — D509 Iron deficiency anemia, unspecified: Secondary | ICD-10-CM

## 2018-01-26 NOTE — Progress Notes (Signed)
[facility] CONSULT NOTE  Patient Care Team: Scot Jun, FNP as PCP - General (Family Medicine)  HEME/ONC OVERVIEW: 1. 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  ASSESSMENT & PLAN  Iron deficiency anemia -I reviewed the patient's external records extensively, including PCP notes -Review of previous labs showed normal CBC in April 2015; hemoglobin 8.1 with MCV 61 in October 2019 -Patient received 1 dose of IV iron on 01/11/2018 -Based on her history of menorrhagia, her iron deficiency anemia is due to chronic blood loss from menstrual cycle -Hgb 10 today; iron profile pending -I reviewed the peripheral blood smear, which showed microcytic RBCs with reticulocytosis, and scattered elliptocytes; there were no apparent target cells or schistocytosis -Given the improvement in Hgb and recent IV iron transfusion, we will hold off further IV iron transfusion today -We will plan to see her back in 6 weeks to recheck CBC and iron profile, and determine if she will require additional IV iron  -Due to the scattered elliptocytes on the peripheral smear, we will also order hemoglobin electrophoresis to rule out any thalassemias  Menorrhagia -Patient was recently started on Depo shot with improvement in menorrhagia -However, she does not have an established gynecologist, and I have referred her to gynecology for further evaluation   Orders Placed This Encounter  Procedures  . CBC with Differential (Cancer Center Only)    Standing Status:   Future    Standing Expiration Date:   03/06/2019  . CMP ([facility] only)    Standing Status:   Future    Standing Expiration Date:   03/06/2019  . Ferritin    Standing Status:   Future    Standing Expiration Date:   03/06/2019  . Iron and TIBC    Standing Status:   Future    Standing Expiration Date:   03/06/2019  . Hemoglobinopathy evaluation    Standing Status:   Future    Standing Expiration Date:   01/30/2019  . Ambulatory  referral to Gynecology    Referral Priority:   Routine    Referral Type:   Consultation    Referral Reason:   Specialty Services Required    Requested Specialty:   Gynecology    Number of Visits Requested:   1    All questions were answered. The patient knows to call the clinic with any problems, questions or concerns.  Return in 6 weeks for labs, possible IV iron infusion, and clinic follow-up.  Tish Men, MD 01/30/18 1:11 PM   CHIEF COMPLAINTS/PURPOSE OF CONSULTATION:  "I am here for my low blood count"  HISTORY OF PRESENTING ILLNESS:  Linda Johnston 17 y.o. female is here because of iron deficiency anemia.  She was found to have abnormal CBC from October 2019; Hgb was 8.1 with MCV 61. Prior to that, her last CBC was done in April 2015, and it was completely normal. She reports having menorrhagia with regular cycle for many years, but has not gone to any PCP for routine blood work until her recent episode of UTI. She denies recent chest pain on exertion, shortness of breath on minimal exertion, pre-syncopal episodes, or palpitations. She had not noticed any recent bleeding such as hematuria, hematochezia or melena. The patient denies over the counter NSAID ingestion. She is not on antiplatelets agents. She had no prior history or diagnosis of cancer.  She denies any pica and eats a variety of diet. She never donated blood or received blood transfusion. She denies any family history  of thalassemia or sickle cell trait.  MEDICAL HISTORY:  None  SURGICAL HISTORY: None  SOCIAL HISTORY: Social History   Socioeconomic History  . Marital status: Single    Spouse name: Not on file  . Number of children: Not on file  . Years of education: Not on file  . Highest education level: Not on file  Occupational History  . Not on file  Social Needs  . Financial resource strain: Not on file  . Food insecurity:    Worry: Not on file    Inability: Not on file  . Transportation  needs:    Medical: Not on file    Non-medical: Not on file  Tobacco Use  . Smoking status: Never Smoker  . Smokeless tobacco: Never Used  . Tobacco comment: Per [employer]-no one smokes around patient  Substance and Sexual Activity  . Alcohol use: No  . Drug use: Not on file  . Sexual activity: Never  Lifestyle  . Physical activity:    Days per week: Not on file    Minutes per session: Not on file  . Stress: Not on file  Relationships  . Social connections:    Talks on phone: Not on file    Gets together: Not on file    Attends religious service: Not on file    Active member of club or organization: Not on file    Attends meetings of clubs or organizations: Not on file    Relationship status: Not on file  . Intimate partner violence:    Fear of current or ex partner: Not on file    Emotionally abused: Not on file    Physically abused: Not on file    Forced sexual activity: Not on file  Other Topics Concern  . Not on file  Social History Narrative   Pt from [place]. Living in [place] with [place] to attend school here. They are unaware of medical history or family history.     FAMILY HISTORY: No family history of sickle cell or thalassemia.  ALLERGIES:   has No Known Allergies.  MEDICATIONS:  Current Outpatient Medications  Medication Sig Dispense Refill  . nitrofurantoin, macrocrystal-monohydrate, (MACROBID) 100 MG capsule Take 1 capsule (100 mg total) by mouth 2 (two) times daily. X 7 days 14 capsule 0  . phenazopyridine (PYRIDIUM) 200 MG tablet Take 1 tablet (200 mg total) by mouth 3 (three) times daily as needed for pain. 6 tablet 0  . traMADol (ULTRAM) 50 MG tablet Take 1 tablet (50 mg total) by mouth every 6 (six) hours as needed. 15 tablet 0   No current facility-administered medications for this visit.     REVIEW OF SYSTEMS:   Constitutional: ( - ) fevers, ( - )  chills , ( - ) night sweats Eyes: ( - ) blurriness of vision, ( - ) double vision, ( - ) watery  eyes Ears, nose, mouth, throat, and face: ( - ) mucositis, ( - ) sore throat Respiratory: ( - ) cough, ( - ) dyspnea, ( - ) wheezes Cardiovascular: ( - ) palpitation, ( - ) chest discomfort, ( - ) lower extremity swelling Gastrointestinal:  ( - ) nausea, ( - ) heartburn, ( - ) change in bowel habits Skin: ( - ) abnormal skin rashes Lymphatics: ( - ) new lymphadenopathy, ( - ) easy bruising Neurological: ( - ) numbness, ( - ) tingling, ( - ) new weaknesses Behavioral/Psych: ( - ) mood change, ( - )  new changes  All other systems were reviewed with the patient and are negative.  PHYSICAL EXAMINATION: ECOG PERFORMANCE STATUS: 0 - Asymptomatic  Vitals:   01/30/18 1227  BP: 111/60  Pulse: 68  Resp: 18  Temp: 98.8 F (37.1 C)  SpO2: 100%   Filed Weights   01/30/18 1227  Weight: 125 lb 0.6 oz (56.7 kg)    GENERAL: alert, no distress and comfortable SKIN: skin color, texture, turgor are normal, no rashes or significant lesions EYES: conjunctiva are pink and non-injected, sclera clear OROPHARYNX: no exudate, no erythema; lips, buccal mucosa, and tongue normal  NECK: supple, non-tender LYMPH:  no palpable lymphadenopathy in the cervical or axillary  LUNGS: clear to auscultation and percussion with normal breathing effort HEART: regular rate & rhythm and no murmurs and no lower extremity edema ABDOMEN: soft, non-tender, non-distended, normal bowel sounds Musculoskeletal: no cyanosis of digits and no clubbing  PSYCH: alert & oriented x 3, fluent speech NEURO: no focal motor/sensory deficits  LABORATORY DATA:  I have reviewed the data as listed Lab Results  Component Value Date   WBC 4.9 01/30/2018   HGB 10.0 (L) 01/30/2018   HCT 34.0 (L) 01/30/2018   MCV 69.7 (L) 01/30/2018   PLT 325 01/30/2018   Recent Labs    12/28/17 1722 01/05/18 2135  NA 137 136  K 3.4* 3.6  CL 106 102  CO2 23 26  GLUCOSE 106* 95  BUN 5* 5*  CREATININE 0.91 0.88  CALCIUM 9.5 9.2  GFRNONAA >60  >60  GFRAA >60 >60  PROT 7.9 8.2*  ALBUMIN 3.8 3.5  AST 21 18  ALT 9 10  ALKPHOS 63 57  BILITOT 0.5 0.3   I personally reviewed the patient's peripheral blood smear today.  The red blood cells were microcytic with reticulocytosis.  In addition, there were scattered elliptocytes and "canoe cells", which can be seen in severe iron deficiency anemia.  There was no schistocytosis.  The white blood cells were of normal morphology. There were no peripheral circulating blasts. The platelets were of normal size and I verified that there were no platelet clumping.

## 2018-01-30 ENCOUNTER — Inpatient Hospital Stay: Payer: Self-pay

## 2018-01-30 ENCOUNTER — Encounter: Payer: Self-pay | Admitting: Hematology

## 2018-01-30 ENCOUNTER — Inpatient Hospital Stay: Payer: Self-pay | Attending: Hematology | Admitting: Hematology

## 2018-01-30 VITALS — BP 111/60 | HR 68 | Temp 98.8°F | Resp 18 | Ht 65.0 in | Wt 125.0 lb

## 2018-01-30 DIAGNOSIS — D509 Iron deficiency anemia, unspecified: Secondary | ICD-10-CM

## 2018-01-30 DIAGNOSIS — N920 Excessive and frequent menstruation with regular cycle: Secondary | ICD-10-CM | POA: Insufficient documentation

## 2018-01-30 DIAGNOSIS — D500 Iron deficiency anemia secondary to blood loss (chronic): Secondary | ICD-10-CM | POA: Insufficient documentation

## 2018-01-30 LAB — CMP (CANCER CENTER ONLY)
ALT: 7 U/L (ref 0–44)
ANION GAP: 8 (ref 5–15)
AST: 17 U/L (ref 15–41)
Albumin: 4 g/dL (ref 3.5–5.0)
Alkaline Phosphatase: 56 U/L (ref 38–126)
BUN: 6 mg/dL (ref 6–20)
CHLORIDE: 107 mmol/L (ref 98–111)
CO2: 23 mmol/L (ref 22–32)
Calcium: 9.7 mg/dL (ref 8.9–10.3)
Creatinine: 0.81 mg/dL (ref 0.44–1.00)
GFR, Est AFR Am: >60 mL/min (ref >60)
Glucose, Bld: 85 mg/dL (ref 70–99)
POTASSIUM: 3.7 mmol/L (ref 3.5–5.1)
Sodium: 138 mmol/L (ref 135–145)
Total Bilirubin: <0.2 mg/dL — ABNORMAL LOW (ref 0.3–1.2)
Total Protein: 7.7 g/dL (ref 6.5–8.1)

## 2018-01-30 LAB — CBC WITH DIFFERENTIAL (CANCER CENTER ONLY)
Abs Immature Granulocytes: 0.01 10*3/uL (ref 0.00–0.07)
BASOS ABS: 0 10*3/uL (ref 0.0–0.1)
Basophils Relative: 1 %
Eosinophils Absolute: 0.1 10*3/uL (ref 0.0–0.5)
Eosinophils Relative: 2 %
HEMATOCRIT: 34 % — AB (ref 36.0–46.0)
Hemoglobin: 10 g/dL — ABNORMAL LOW (ref 12.0–15.0)
IMMATURE GRANULOCYTES: 0 %
Lymphocytes Relative: 34 %
Lymphs Abs: 1.7 10*3/uL (ref 0.7–4.0)
MCH: 20.5 pg — ABNORMAL LOW (ref 26.0–34.0)
MCHC: 29.4 g/dL — ABNORMAL LOW (ref 30.0–36.0)
MCV: 69.7 fL — ABNORMAL LOW (ref 80.0–100.0)
MONOS PCT: 8 %
Monocytes Absolute: 0.4 10*3/uL (ref 0.1–1.0)
NEUTROS ABS: 2.7 10*3/uL (ref 1.7–7.7)
NEUTROS PCT: 55 %
Platelet Count: 325 10*3/uL (ref 150–400)
RBC: 4.88 MIL/uL (ref 3.87–5.11)
WBC Count: 4.9 10*3/uL (ref 4.0–10.5)
nRBC: 0 % (ref 0.0–0.2)

## 2018-01-30 LAB — DIRECT ANTIGLOBULIN TEST (NOT AT ARMC)
DAT, COMPLEMENT: NEGATIVE
DAT, IGG: NEGATIVE

## 2018-01-30 LAB — LACTATE DEHYDROGENASE: LDH: 173 U/L (ref 98–192)

## 2018-01-31 LAB — IRON AND TIBC
IRON: 35 ug/dL — AB (ref 41–142)
Saturation Ratios: 10 % — ABNORMAL LOW (ref 21–57)
TIBC: 361 ug/dL (ref 236–444)
UIBC: 327 ug/dL (ref 120–384)

## 2018-01-31 LAB — FERRITIN: Ferritin: 49 ng/mL (ref 11–307)

## 2018-02-01 ENCOUNTER — Other Ambulatory Visit: Payer: Self-pay | Admitting: Hematology

## 2018-02-01 DIAGNOSIS — D500 Iron deficiency anemia secondary to blood loss (chronic): Secondary | ICD-10-CM

## 2018-02-01 DIAGNOSIS — N920 Excessive and frequent menstruation with regular cycle: Secondary | ICD-10-CM

## 2018-02-01 NOTE — Progress Notes (Unsigned)
[place]co

## 2018-02-06 ENCOUNTER — Telehealth: Payer: Self-pay | Admitting: Obstetrics and Gynecology

## 2018-02-06 ENCOUNTER — Ambulatory Visit: Payer: Self-pay | Admitting: Family Medicine

## 2018-02-06 NOTE — Telephone Encounter (Signed)
Called and left a message for patient to call back to schedule a new patient doctor referral appointment with our office to see any provider for: Menorrhagia, iron deficiency anemia.

## 2018-02-07 ENCOUNTER — Telehealth: Payer: Self-pay | Admitting: Hematology & Oncology

## 2018-02-07 NOTE — Telephone Encounter (Signed)
Appointments for 12/17 were r/s and I spoke with patient per 11/13 email from [facility]

## 2018-02-12 ENCOUNTER — Ambulatory Visit: Payer: Self-pay | Admitting: Family Medicine

## 2018-02-27 ENCOUNTER — Telehealth: Payer: Self-pay | Admitting: Family Medicine

## 2018-02-27 ENCOUNTER — Ambulatory Visit (INDEPENDENT_AMBULATORY_CARE_PROVIDER_SITE_OTHER): Payer: Self-pay | Admitting: Family Medicine

## 2018-02-27 ENCOUNTER — Encounter: Payer: Self-pay | Admitting: Family Medicine

## 2018-02-27 DIAGNOSIS — R300 Dysuria: Secondary | ICD-10-CM

## 2018-02-27 DIAGNOSIS — R399 Unspecified symptoms and signs involving the genitourinary system: Secondary | ICD-10-CM

## 2018-02-27 DIAGNOSIS — R1030 Lower abdominal pain, unspecified: Secondary | ICD-10-CM

## 2018-02-27 DIAGNOSIS — R350 Frequency of micturition: Secondary | ICD-10-CM

## 2018-02-27 DIAGNOSIS — Z3202 Encounter for pregnancy test, result negative: Secondary | ICD-10-CM

## 2018-02-27 LAB — POCT URINALYSIS DIP (CLINITEK)
Bilirubin, UA: NEGATIVE
Glucose, UA: NEGATIVE mg/dL
Ketones, POC UA: NEGATIVE mg/dL
Leukocytes, UA: NEGATIVE
Nitrite, UA: NEGATIVE
Spec Grav, UA: >=1.03 — AB (ref 1.010–1.025)
Urobilinogen, UA: 0.2 E.U./dL
pH, UA: 5.5 (ref 5.0–8.0)

## 2018-02-27 LAB — POCT URINE PREGNANCY: Preg Test, Ur: NEGATIVE

## 2018-02-27 NOTE — Telephone Encounter (Signed)
Patient called notified.

## 2018-02-27 NOTE — Progress Notes (Signed)
UA reviewed only significant for cloudiness, RBCs which is likely related to current menstrual cycle.  Will obtain an hCG to rule out pregnancy and follow-up with patient by phone.  Urine culture pending.

## 2018-02-27 NOTE — Progress Notes (Signed)
Patient stopped in the office & stated that she needed refills on her medications she was given for the UTI & lower abdominal cramping/discomfort. She states that her symptoms did resolve but she's starting to have the discomfort & urinary frequency, pain with urination again. No fever, nausea, back pain. Had patient leave a urine sample & advised that I would call her later with results/follow up information. KWalker, CMA.

## 2018-02-27 NOTE — Telephone Encounter (Signed)
See notes in chart need an hCG pregnancy and urine culture notify patient we will let her know once results of urine culture are completed.

## 2018-03-01 ENCOUNTER — Encounter: Payer: Self-pay | Admitting: Family Medicine

## 2018-03-01 LAB — URINE CULTURE: Organism ID, Bacteria: NO GROWTH

## 2018-03-01 NOTE — Progress Notes (Signed)
Mail lab letter, normal result

## 2018-03-13 ENCOUNTER — Ambulatory Visit: Payer: Self-pay | Admitting: Hematology

## 2018-03-13 ENCOUNTER — Ambulatory Visit: Payer: Self-pay

## 2018-03-13 ENCOUNTER — Other Ambulatory Visit: Payer: Self-pay

## 2018-03-15 ENCOUNTER — Inpatient Hospital Stay: Payer: Self-pay

## 2018-03-15 ENCOUNTER — Ambulatory Visit: Payer: Self-pay | Admitting: Hematology

## 2018-03-23 ENCOUNTER — Other Ambulatory Visit: Payer: Self-pay

## 2018-03-23 ENCOUNTER — Other Ambulatory Visit: Payer: Self-pay | Admitting: Hematology

## 2018-03-23 ENCOUNTER — Encounter (HOSPITAL_COMMUNITY): Payer: Self-pay | Admitting: Emergency Medicine

## 2018-03-23 ENCOUNTER — Emergency Department (HOSPITAL_COMMUNITY)
Admission: EM | Admit: 2018-03-23 | Discharge: 2018-03-23 | Disposition: A | Discharge status: Home / Self Care | Payer: Self-pay | Attending: Emergency Medicine | Admitting: Emergency Medicine

## 2018-03-23 DIAGNOSIS — R21 Rash and other nonspecific skin eruption: Secondary | ICD-10-CM | POA: Insufficient documentation

## 2018-03-23 MED ORDER — DIPHENHYDRAMINE-ZINC ACETATE 2-0.1 % EX CREA: 1.0000 "application " | TOPICAL_CREAM | Freq: Three times a day (TID) | CUTANEOUS | 0 refills | Status: DC | PRN

## 2018-03-23 NOTE — Discharge Instructions (Signed)
Please follow up with your primary care provider within 5-7 days for re-evaluation of your symptoms. If you do not have a primary care provider, information for a healthcare clinic has been provided for you to make arrangements for follow up care.  Please return to the emergency room immediately if you experience any new or worsening symptoms or any symptoms that indicate worsening infection such as fevers, increased redness/swelling/pain, warmth, or drainage from the affected area.    

## 2018-03-23 NOTE — ED Provider Notes (Signed)
[facility] EMERGENCY DEPARTMENT Provider Note   CSN: [id number] Arrival date & time: 03/23/18  1607     History   Chief Complaint Chief Complaint  Patient presents with  . Rash    HPI Linda Johnston is a 17 y.o. female.  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 She was wearing gloves and denies that she got any chemicals on her. She felt sob at that time but this has resolved. No swelling to the mouth, tongue or throat. No difficulty swallowing. Tried putting water on the are but it made her sxs worse. States she currently has no symptoms at all.   History reviewed. No pertinent past medical history.  Patient Active Problem List   Diagnosis Date Noted  . Iron deficiency anemia due to chronic blood loss 01/30/2018  . Menorrhagia 01/30/2018  . Anemia 01/11/2018  . Seizure ([place]) 01/06/2013  . Headache 01/06/2013  . Emesis 01/06/2013  . 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 No pertinent surgical history.   OB History   No obstetric history on file.      Home Medications    Prior to Admission medications   Medication Sig Start Date End Date Taking? Authorizing Provider  diphenhydrAMINE-zinc acetate (BENADRYL EXTRA STRENGTH) cream Apply 1 application topically 3 (three) times daily as needed for itching. 03/23/18   Chastity Noland S, PA-C    Family History No family history on file.  Social History Social History   Tobacco Use  . Smoking status: Never Smoker  . Smokeless tobacco: Never Used  . Tobacco comment: Per [employer]-no one smokes around patient  Substance Use Topics  . Alcohol use: No  . Drug use: Not Currently     Allergies   Patient has no known allergies.   Review of Systems Review of Systems  Constitutional: Negative for fever.    Eyes: Negative for photophobia.  Respiratory: Positive for shortness of breath (resolved).   Cardiovascular: Negative for chest pain.  Gastrointestinal: Negative for abdominal pain.  Genitourinary: Negative for flank pain.  Musculoskeletal: Negative for back pain.  Skin: Positive for rash.     Physical Exam Updated Vital Signs BP 110/74   Pulse 79   Temp 98.1 F (36.7 C)   Resp 16   SpO2 100%   Physical Exam Vitals signs and nursing note reviewed.  Constitutional:      General: She is not in acute distress.    Appearance: She is well-developed.  HENT:     Head: Normocephalic and atraumatic.     Mouth/Throat:     Mouth: Mucous membranes are moist.     Pharynx: No oropharyngeal exudate or posterior oropharyngeal erythema.     Comments: No angioedema. Eyes:     Conjunctiva/sclera: Conjunctivae normal.  Neck:     Musculoskeletal: Neck supple.  Cardiovascular:     Rate and Rhythm: Normal rate.     Heart sounds: Normal heart sounds. No murmur.  Pulmonary:     Effort: Pulmonary effort is normal. No respiratory distress.     Breath sounds: Normal breath sounds. No stridor. No wheezing or rhonchi.  Abdominal:     Palpations: Abdomen is soft.  Musculoskeletal: Normal range of motion.  Skin:    General: Skin is warm and  dry.     Comments: Small excoriation to the skin on the right side of the neck that is mildly tender to palpation.  No significant erythema, swelling, induration or fluctuance.  Neurological:     Mental Status: She is alert.  Psychiatric:     Comments: Flat affect      ED Treatments / Results  Labs (all labs ordered are listed, but only abnormal results are displayed) Labs Reviewed - No data to display  EKG None  Radiology No results found.  Procedures Procedures (including critical care time)  Medications Ordered in ED Medications - No data to display   Initial Impression / Assessment and Plan / ED Course  I have reviewed the triage  vital signs and the nursing notes.  Pertinent labs & imaging results that were available during my care of the patient were reviewed by me and considered in my medical decision making (see chart for details).     Final Clinical Impressions(s) / ED Diagnoses   Final diagnoses:  Rash and nonspecific skin eruption   Patient presenting to the ED today complaining of painful rash to the right side of the neck that occurred about 5 hours prior to arrival while she was at work.  States she was packing some perfume in the boxes but denies any exposure to chemicals.  She denies any known insect bites.  She is never had symptoms like this before.  On exam she has a very small excoriation to the skin on the right side of the neck that is mildly tender.  There is no evidence of surrounding cellulitis or infection.  No swelling.  No angioedema.  Lungs are clear.  Unclear etiology of symptoms, however suspect patient may have been stung by something.  Have advised her to keep the area clean and dry and monitor for any signs of infection.  Will give Rx for Benadryl cream to help with symptoms.  Have advised her to return if new or worsening symptoms develop.  She voices understanding of the plan and reasons to return.  All questions answered.  ED Discharge Orders         Ordered    diphenhydrAMINE-zinc acetate (BENADRYL EXTRA STRENGTH) cream  3 times daily PRN     03/23/18 2037           Rodney Booze, PA-C 03/23/18 2037    Deno Etienne, DO 03/25/18 1511

## 2018-03-23 NOTE — ED Triage Notes (Signed)
Pt reports being at work 1440 when her neck began to itch on the right side. Denies coming in contact with anything.

## 2018-03-30 ENCOUNTER — Inpatient Hospital Stay (HOSPITAL_BASED_OUTPATIENT_CLINIC_OR_DEPARTMENT_OTHER): Payer: Self-pay | Admitting: Hematology

## 2018-03-30 ENCOUNTER — Ambulatory Visit: Payer: Self-pay

## 2018-03-30 ENCOUNTER — Encounter: Payer: Self-pay | Admitting: Hematology

## 2018-03-30 ENCOUNTER — Inpatient Hospital Stay: Payer: Self-pay | Attending: Hematology

## 2018-03-30 VITALS — BP 92/65 | HR 92 | Temp 97.8°F | Resp 18 | Ht 65.0 in | Wt 123.4 lb

## 2018-03-30 DIAGNOSIS — N920 Excessive and frequent menstruation with regular cycle: Secondary | ICD-10-CM | POA: Insufficient documentation

## 2018-03-30 DIAGNOSIS — D509 Iron deficiency anemia, unspecified: Secondary | ICD-10-CM | POA: Insufficient documentation

## 2018-03-30 DIAGNOSIS — D500 Iron deficiency anemia secondary to blood loss (chronic): Secondary | ICD-10-CM

## 2018-03-30 LAB — CMP (CANCER CENTER ONLY)
ALT: 9 U/L (ref 0–44)
AST: 17 U/L (ref 15–41)
Albumin: 4.3 g/dL (ref 3.5–5.0)
Alkaline Phosphatase: 67 U/L (ref 38–126)
Anion gap: 7 (ref 5–15)
BUN: 7 mg/dL (ref 6–20)
CALCIUM: 9.6 mg/dL (ref 8.9–10.3)
CO2: 25 mmol/L (ref 22–32)
Chloride: 104 mmol/L (ref 98–111)
Creatinine: 0.87 mg/dL (ref 0.44–1.00)
GFR, Est AFR Am: >60 mL/min (ref >60)
GFR, Estimated: >60 mL/min (ref >60)
Glucose, Bld: 88 mg/dL (ref 70–99)
Potassium: 3.9 mmol/L (ref 3.5–5.1)
Sodium: 136 mmol/L (ref 135–145)
Total Bilirubin: 0.6 mg/dL (ref 0.3–1.2)
Total Protein: 8 g/dL (ref 6.5–8.1)

## 2018-03-30 LAB — CBC WITH DIFFERENTIAL (CANCER CENTER ONLY)
Abs Immature Granulocytes: 0.02 10*3/uL (ref 0.00–0.07)
Basophils Absolute: 0 10*3/uL (ref 0.0–0.1)
Basophils Relative: 1 %
EOS PCT: 2 %
Eosinophils Absolute: 0.1 10*3/uL (ref 0.0–0.5)
HCT: 36.8 % (ref 36.0–46.0)
Hemoglobin: 11.2 g/dL — ABNORMAL LOW (ref 12.0–15.0)
Immature Granulocytes: 0 %
Lymphocytes Relative: 23 %
Lymphs Abs: 1.8 10*3/uL (ref 0.7–4.0)
MCH: 22.8 pg — ABNORMAL LOW (ref 26.0–34.0)
MCHC: 30.4 g/dL (ref 30.0–36.0)
MCV: 74.8 fL — ABNORMAL LOW (ref 80.0–100.0)
Monocytes Absolute: 0.6 10*3/uL (ref 0.1–1.0)
Monocytes Relative: 8 %
Neutro Abs: 5.1 10*3/uL (ref 1.7–7.7)
Neutrophils Relative %: 66 %
Platelet Count: 364 10*3/uL (ref 150–400)
RBC: 4.92 MIL/uL (ref 3.87–5.11)
RDW: 20.6 % — ABNORMAL HIGH (ref 11.5–15.5)
WBC Count: 7.7 10*3/uL (ref 4.0–10.5)
nRBC: 0 % (ref 0.0–0.2)

## 2018-03-30 MED ORDER — FERROUS SULFATE 325 (65 FE) MG PO TBEC: 325.0000 mg | DELAYED_RELEASE_TABLET | ORAL | 3 refills | Status: DC

## 2018-03-30 NOTE — Progress Notes (Signed)
[place] OFFICE PROGRESS NOTE  Patient Care Team: Scot Jun, FNP as PCP - General (Family Medicine)  HEME/ONC OVERVIEW: 1. 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  TREATMENT REGIMEN:  PRN IV iron, last in 12/2017  ASSESSMENT & PLAN   Iron deficiency anemia -Secondary to menorrhagia  -Patient received 1 dose of IV iron on 01/11/2018 -Hgb 11.2 today, MCV 75; improving  -Hemoglobin electrophoresis pending, given the presence of elliptocytes on recent peripheral blood smear  -Given the improvement in Hgb, we will hold off further IV iron transfusion today -I have prescribed oral iron supplement to be taken every other day   Menorrhagia -Patient was recently started on Depo shot with improvement in menorrhagia -Due to insurance issues, patient has been unable to be referred to gynecology -I recommend her to continue close follow-up with her PCP for the management of menorrhagia   Orders Placed This Encounter  Procedures  . CBC with Differential (Cancer Center Only)    Standing Status:   Future    Standing Expiration Date:   05/04/2019  . Ferritin    Standing Status:   Future    Standing Expiration Date:   05/04/2019  . Iron and TIBC    Standing Status:   Future    Standing Expiration Date:   05/04/2019    All questions were answered. The patient knows to call the clinic with any problems, questions or concerns. No barriers to learning was detected.  A total of more than 15 minutes were spent face-to-face with the patient during this encounter and over half of that time was spent on counseling and coordination of care as outlined above.   Return in 3 months for labs and clinic follow-up.   Tish Men, MD 03/30/2018 1:44 PM  CHIEF COMPLAINT: "I am doing fine"  INTERVAL HISTORY: Ms. Barsanti returns to clinic for follow-up of iron deficiency anemia secondary to menorrhagia.  Patient reports that since last visit, her menorrhagia has continued to improve  with double shots, administered by her PCP.  She presented to the ER over the weekend during Christmas for diffuse neck rash, for which she was prescribed PRN Benadryl with resolution of the symptoms.  She denies any recurrent rash today.  She otherwise feels well, denies any fever, chill, weight loss, night sweats, chest pain, dyspnea, abdominal pain, or abnormal bleeding or bruising.  REVIEW OF SYSTEMS:   Constitutional: ( - ) fevers, ( - )  chills , ( - ) night sweats Eyes: ( - ) blurriness of vision, ( - ) double vision, ( - ) watery eyes Ears, nose, mouth, throat, and face: ( - ) mucositis, ( - ) sore throat Respiratory: ( - ) cough, ( - ) dyspnea, ( - ) wheezes Cardiovascular: ( - ) palpitation, ( - ) chest discomfort, ( - ) lower extremity swelling Gastrointestinal:  ( - ) nausea, ( - ) heartburn, ( - ) change in bowel habits Skin: ( - ) abnormal skin rashes Lymphatics: ( - ) new lymphadenopathy, ( - ) easy bruising Neurological: ( - ) numbness, ( - ) tingling, ( - ) new weaknesses Behavioral/Psych: ( - ) mood change, ( - ) new changes  All other systems were reviewed with the patient and are negative.  I have reviewed the past medical history, past surgical history, social history and family history with the patient and they are unchanged from previous note.  ALLERGIES:  has No Known Allergies.  MEDICATIONS:  Current  Outpatient Medications  Medication Sig Dispense Refill  . diphenhydrAMINE-zinc acetate (BENADRYL EXTRA STRENGTH) cream Apply 1 application topically 3 (three) times daily as needed for itching. 28.4 g 0  . ferrous sulfate 325 (65 FE) MG EC tablet Take 1 tablet (325 mg total) by mouth every other day. 45 tablet 3   No current facility-administered medications for this visit.     PHYSICAL EXAMINATION: ECOG PERFORMANCE STATUS: 0 - Asymptomatic  Today's Vitals   03/30/18 1329 03/30/18 1330  BP: 92/65   Pulse: 92   Resp: 18   Temp: 97.8 F (36.6 C)   TempSrc:  Oral   SpO2: 100%   Weight: 123 lb 6.4 oz (56 kg)   Height: 5\' 5"  (1.651 m)   PainSc: 0-No pain 0-No pain   Body mass index is 20.53 kg/m.  Filed Weights   03/30/18 1329  Weight: 123 lb 6.4 oz (56 kg)    GENERAL: alert, no distress and comfortable SKIN: skin color, texture, turgor are normal, no rashes or significant lesions EYES: conjunctiva are pink and non-injected, sclera clear OROPHARYNX: no exudate, no erythema; lips, buccal mucosa, and tongue normal  NECK: supple, non-tender LYMPH:  no palpable lymphadenopathy in the cervical or axillary  LUNGS: clear to auscultation and percussion with normal breathing effort HEART: regular rate & rhythm and no murmurs and no lower extremity edema ABDOMEN: soft, non-tender, non-distended, normal bowel sounds Musculoskeletal: no cyanosis of digits and no clubbing  PSYCH: alert & oriented x 3, fluent speech NEURO: no focal motor/sensory deficits  LABORATORY DATA:  I have reviewed the data as listed    Component Value Date/Time   NA 138 01/30/2018 1217   K 3.7 01/30/2018 1217   CL 107 01/30/2018 1217   CO2 23 01/30/2018 1217   GLUCOSE 85 01/30/2018 1217   BUN 6 01/30/2018 1217   CREATININE 0.81 01/30/2018 1217   CALCIUM 9.7 01/30/2018 1217   PROT 7.7 01/30/2018 1217   ALBUMIN 4.0 01/30/2018 1217   AST 17 01/30/2018 1217   ALT 7 01/30/2018 1217   ALKPHOS 56 01/30/2018 1217   BILITOT <0.2 (L) 01/30/2018 1217   GFRNONAA >60 01/30/2018 1217   GFRAA >60 01/30/2018 1217    No results found for: SPEP, UPEP  Lab Results  Component Value Date   WBC 7.7 03/30/2018   NEUTROABS 5.1 03/30/2018   HGB 11.2 (L) 03/30/2018   HCT 36.8 03/30/2018   MCV 74.8 (L) 03/30/2018   PLT 364 03/30/2018      Chemistry      Component Value Date/Time   NA 138 01/30/2018 1217   K 3.7 01/30/2018 1217   CL 107 01/30/2018 1217   CO2 23 01/30/2018 1217   BUN 6 01/30/2018 1217   CREATININE 0.81 01/30/2018 1217      Component Value Date/Time    CALCIUM 9.7 01/30/2018 1217   ALKPHOS 56 01/30/2018 1217   AST 17 01/30/2018 1217   ALT 7 01/30/2018 1217   BILITOT <0.2 (L) 01/30/2018 1217

## 2018-04-02 LAB — IRON AND TIBC
IRON: 31 ug/dL — AB (ref 41–142)
Saturation Ratios: 7 % — ABNORMAL LOW (ref 21–57)
TIBC: 431 ug/dL (ref 236–444)
UIBC: 401 ug/dL — ABNORMAL HIGH (ref 120–384)

## 2018-04-02 LAB — HEMOGLOBINOPATHY EVALUATION
Hgb A2 Quant: 1.9 % (ref 1.8–3.2)
Hgb A: 98.1 % (ref 96.4–98.8)
Hgb C: 0 %
Hgb F Quant: 0 % (ref 0.0–2.0)
Hgb S Quant: 0 %
Hgb Variant: 0 %

## 2018-04-02 LAB — FERRITIN: Ferritin: <4 ng/mL — ABNORMAL LOW (ref 11–307)

## 2018-04-11 ENCOUNTER — Ambulatory Visit (INDEPENDENT_AMBULATORY_CARE_PROVIDER_SITE_OTHER): Payer: Self-pay

## 2018-04-11 DIAGNOSIS — Z3042 Encounter for surveillance of injectable contraceptive: Secondary | ICD-10-CM

## 2018-04-11 MED ORDER — MEDROXYPROGESTERONE ACETATE 150 MG/ML IM SUSP
150.0000 mg | Freq: Once | INTRAMUSCULAR | Status: AC
  Administered 2018-04-11: 150 mg via INTRAMUSCULAR

## 2018-04-11 NOTE — Progress Notes (Signed)
Patient here for Depo-Provera injection. Is still in her window for injection. Has had some light spotting. No headaches, heavy vaginal bleeding, cramping, etc. Next shot due between April 2-April 16. KWalker, CMA.

## 2018-05-09 ENCOUNTER — Encounter: Payer: Self-pay | Admitting: Family Medicine

## 2018-05-09 ENCOUNTER — Ambulatory Visit (INDEPENDENT_AMBULATORY_CARE_PROVIDER_SITE_OTHER): Payer: Self-pay | Admitting: Family Medicine

## 2018-05-09 VITALS — BP 102/69 | HR 83 | Resp 17 | Ht 65.0 in | Wt 125.4 lb

## 2018-05-09 DIAGNOSIS — R21 Rash and other nonspecific skin eruption: Secondary | ICD-10-CM

## 2018-05-09 DIAGNOSIS — T148XXA Other injury of unspecified body region, initial encounter: Secondary | ICD-10-CM

## 2018-05-09 MED ORDER — CLINDAMYCIN HCL 300 MG PO CAPS: 300.0000 mg | ORAL_CAPSULE | Freq: Three times a day (TID) | ORAL | 0 refills | Status: AC

## 2018-05-09 MED ORDER — TRIAMCINOLONE ACETONIDE 0.1 % EX CREA: 1.0000 "application " | TOPICAL_CREAM | Freq: Two times a day (BID) | CUTANEOUS | 1 refills | Status: AC

## 2018-05-09 NOTE — Patient Instructions (Signed)
Blisters, Adult  A blister is a raised bubble of skin filled with liquid. Blisters often develop in an area of the skin that repeatedly rubs or presses against another surface (friction blister). Friction blisters can occur on any part of the body, but they usually develop on the hands or feet. Long-term pressure on the same area of the skin can also lead to areas of hardened skin (calluses). What are the causes? A blister can be caused by:  An injury.  A burn.  An allergic reaction.  An infection.  Exposure to irritating chemicals.  Friction, especially in an area with a lot of heat and moisture. Friction blisters often result from:  Sports.  Repetitive activities.  Using tools and doing other activities without wearing gloves.  Shoes that are too tight or too loose. What are the signs or symptoms? A blister is often round and looks like a bump. It may:  Itch.  Be painful to the touch. Before a blister forms, the skin may:  Become red.  Feel warm.  Itch.  Be painful to the touch. How is this diagnosed? A blister is diagnosed with a physical exam. How is this treated? Treatment usually involves protecting the area where the blister has formed until the skin has healed. Other treatments may include:  A bandage (dressing) to cover the blister.  Extra padding around and over the blister, so that it does not rub on anything.  Antibiotic ointment. Most blisters break open, dry up, and go away on their own within 1-2 weeks. Blisters that are very painful may be drained before they break open on their own. If the blister is large or painful, it can be drained by: 1. Sterilizing a small needle with rubbing alcohol. 2. Washing your hands with soap and water. 3. Inserting the needle in the edge of the blister to make a small hole. Some fluid will drain out of the hole. Let the top or roof of the blister stay in place. This helps the skin heal. 4. Washing the blister with  mild soap and water. 5. Covering the blister with antibiotic ointment, if prescribed by your health care provider, and a dressing. Some blisters may need to be drained by a health care provider. Follow these instructions at home:  Protect the area where the blister has formed as told by your health care provider.  Keep your blister clean and dry. This helps to prevent infection.  Do not pop your blister. This can cause infection.  If you were prescribed an antibiotic, use it as told by your health care provider. Do not stop using the antibiotic even if your condition improves.  Wear different shoes until the blister heals.  Avoid the activity that caused the blister until your blister heals.  Check your blister every day for signs of infection. Check for: ? More redness, swelling, or pain. ? More fluid or blood. ? Warmth. ? Pus or a bad smell. ? The blister getting better and then getting worse. How is this prevented? Taking these steps can help to prevent blisters that are caused by friction. Make sure you:  Wear comfortable shoes that fit well.  Always wear socks with shoes.  Wear extra socks or use tape, bandages, or pads over blister-prone areas as needed. You may also apply petroleum jelly under bandages in blister-prone areas.  Wear protective gear, such as gloves, when participating in sports or activities that can cause blisters.  Wear loose-fitting, moisture-wicking clothes when participating in  wear socks with shoes.   Wear extra socks or use tape, bandages, or pads over blister-prone areas as needed. You may also apply petroleum jelly under bandages in blister-prone areas.   Wear protective gear, such as gloves, when participating in sports or activities that can cause blisters.   Wear loose-fitting, moisture-wicking clothes when participating in sports or activities.   Use powders as needed to keep your feet dry.  Contact a health care provider if:   You have more redness, swelling, or pain around your blister.   You have more fluid or blood coming from your blister.   Your blister feels warm to the touch.   You have pus or a bad smell coming from your blister.   You have a fever or chills.   Your blister gets better and then it gets worse.  This information is not intended to replace advice given to you by your  health care provider. Make sure you discuss any questions you have with your health care provider.  Document Released: 04/21/2004 Document Revised: 11/11/2015 Document Reviewed: 09/25/2015  Elsevier Interactive Patient Education  2019 [employer]

## 2018-05-09 NOTE — Progress Notes (Signed)
.   Established Patient Office Visit  Subjective:  Patient ID: Linda Johnston, female    DOB: 2001-01-08  Age: 18 y.o. MRN: [id number]  CC:  Chief Complaint  Patient presents with  . Rash    HPI Linda Johnston presents for evaluation of a rash. Onset: Late December (possibly) patient not exactly certain  Location: Entire body  Symptoms: initially itching, red raised blisters, fluid filled. She opens blisters, clear fluids drains and wound scab over and turn dark  Evaluation: evaluated for a difference type rash in ER in December  and prescribed diphenhydramine applications. That rash resolved. The rash she is experincing today is a new problem. No recent moves. 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 No overnight stays elsewhere or recent travel out of the country. No other persons in home have a rash. Exacerbating factors: unknown  Alleviating factors: picking at the lesions and draining them manually  She denies fever, or increased warmth of area, nausea, or vomiting. Social History   Socioeconomic History  . Marital status: Single    Spouse name: Not on file  . Number of children: Not on file  . Years of education: Not on file  . Highest education level: Not on file  Occupational History  . Not on file  Social Needs  . Financial resource strain: Not on file  . Food insecurity:    Worry: Not on file    Inability: Not on file  . Transportation needs:    Medical: Not on file    Non-medical: Not on file  Tobacco Use  . Smoking status: Never Smoker  . Smokeless tobacco: Never Used  . Tobacco comment: Per [employer]-no one smokes around patient  Substance and Sexual Activity  . Alcohol use: No  . Drug use: Not Currently  . Sexual activity: Never  Lifestyle  . Physical activity:    Days per week: Not on file    Minutes per session: Not on file  . Stress: Not on file  Relationships  .  Social connections:    Talks on phone: Not on file    Gets together: Not on file    Attends religious service: Not on file    Active member of club or organization: Not on file    Attends meetings of clubs or organizations: Not on file    Relationship status: Not on file  . Intimate partner violence:    Fear of current or ex partner: Not on file    Emotionally abused: Not on file    Physically abused: Not on file    Forced sexual activity: Not on file  Other Topics Concern  . Not on file  Social History Narrative   Pt from [place]. Living in [place] with [place] to attend school here. They are unaware of medical history or family history.     Outpatient Medications Prior to Visit  Medication Sig Dispense Refill  . medroxyPROGESTERone (DEPO-PROVERA) 150 MG/ML injection Inject 150 mg into the muscle every 3 (three) months.    . ferrous sulfate 325 (65 FE) MG EC tablet Take 1 tablet (325 mg total) by mouth every other day. 45 tablet 3  . diphenhydrAMINE-zinc acetate (BENADRYL EXTRA STRENGTH) cream Apply 1 application topically 3 (three) times daily as needed for itching. 28.4 g 0   No facility-administered medications prior to visit.     No Known Allergies  ROS Review  of Systems Pertinent negatives listed in HPI   Objective:    Physical Exam BP 102/69   Pulse 83   Resp 17   Ht 5\' 5"  (1.651 m)   Wt 125 lb 6.4 oz (56.9 kg)   SpO2 98%   BMI 20.87 kg/m   General appearance: alert, well developed, well nourished, cooperative and in no distress Head: Normocephalic, without obvious abnormality, atraumatic Respiratory: Respirations even and unlabored, normal respiratory rate Heart: Regular rate and rhythm. No murmurs or gallops noted.  Extremities: No gross deformities Skin: Multiple excoriated lesions present on lower extremities, neck, back and bilateral inner breast. Multiple areas of darkened skin surrounding excoriated lesions.  There is no increased warmth or  erythema. Psych: Appropriate mood and affect. Neurologic: Mental status: Alert, oriented to person, place, and time, thought content appropriate.  Wt Readings from Last 3 Encounters:  05/09/18 125 lb 6.4 oz (56.9 kg) (45 %, Z= -0.14)*  03/30/18 123 lb 6.4 oz (56 kg) (41 %, Z= -0.23)*  01/30/18 125 lb 0.6 oz (56.7 kg) (45 %, Z= -0.13)*   * Growth percentiles are based on CDC (Girls, 2-20 Years) data.     Health Maintenance Due  Topic Date Due  . HIV Screening  06/14/2013  . TETANUS/TDAP  06/14/2017    There are no preventive care reminders to display for this patient.  Lab Results  Component Value Date   TSH 3.264 01/06/2013   Lab Results  Component Value Date   WBC 7.7 03/30/2018   HGB 11.2 (L) 03/30/2018   HCT 36.8 03/30/2018   MCV 74.8 (L) 03/30/2018   PLT 364 03/30/2018   Lab Results  Component Value Date   NA 136 03/30/2018   K 3.9 03/30/2018   CO2 25 03/30/2018   GLUCOSE 88 03/30/2018   BUN 7 03/30/2018   CREATININE 0.87 03/30/2018   BILITOT 0.6 03/30/2018   ALKPHOS 67 03/30/2018   AST 17 03/30/2018   ALT 9 03/30/2018   PROT 8.0 03/30/2018   ALBUMIN 4.3 03/30/2018   CALCIUM 9.6 03/30/2018   ANIONGAP 7 03/30/2018     Assessment & Plan:  1. Rash and other nonspecific skin eruption - CBC with Differential - Varicella Zoster Abs, IgG/IgM - WOUND CULTURE  2. Blister - CBC with Differential - Varicella Zoster Abs, IgG/IgM - WOUND CULTURE  Differentials include bedbugs, scabies, impetigo, or varicella (which is less likely).I will obtain a varicella IgG and IgM to rule out a possible recent varicella infection.  Suspect more than likely bedbugs given the attributions of lesions throughout the skin.  No one else in the household has been affected.  Concern for infection as there is multiple excoriated lesions on patient's legs and in her breast from scratching.  Will prescribe Keflex twice daily x10 days along with a topical triamcinolone cream to help with  itching and decrease inflammation.  We will have patient return back in office within 10 days to evaluate lesions.  If no improvement will refer patient to dermatology.  Follow-up: Return in about 12 days (around 05/21/2018).    Molli Barrows, FNP-C

## 2018-05-10 LAB — CBC WITH DIFFERENTIAL/PLATELET
Basophils Absolute: 0.1 10*3/uL (ref 0.0–0.2)
Basos: 1 %
EOS (ABSOLUTE): 0.1 10*3/uL (ref 0.0–0.4)
Eos: 1 %
Hematocrit: 36.3 % (ref 34.0–46.6)
Hemoglobin: 11.9 g/dL (ref 11.1–15.9)
Immature Grans (Abs): 0 10*3/uL (ref 0.0–0.1)
Immature Granulocytes: 0 %
Lymphocytes Absolute: 2 10*3/uL (ref 0.7–3.1)
Lymphs: 30 %
MCH: 23 pg — ABNORMAL LOW (ref 26.6–33.0)
MCHC: 32.8 g/dL (ref 31.5–35.7)
MCV: 70 fL — ABNORMAL LOW (ref 79–97)
Monocytes Absolute: 0.6 10*3/uL (ref 0.1–0.9)
Monocytes: 9 %
Neutrophils Absolute: 4 10*3/uL (ref 1.4–7.0)
Neutrophils: 59 %
Platelets: 463 10*3/uL — ABNORMAL HIGH (ref 150–450)
RBC: 5.17 x10E6/uL (ref 3.77–5.28)
RDW: 14.9 % (ref 11.7–15.4)
WBC: 6.7 10*3/uL (ref 3.4–10.8)

## 2018-05-10 LAB — VARICELLA ZOSTER ABS, IGG/IGM
Varicella IgM: <0.91 index (ref 0.00–0.90)
Varicella zoster IgG: 1714 index (ref >165)

## 2018-05-12 LAB — WOUND CULTURE: Organism ID, Bacteria: NONE SEEN

## 2018-06-29 ENCOUNTER — Inpatient Hospital Stay: Payer: Self-pay | Attending: Hematology | Admitting: Hematology

## 2018-06-29 ENCOUNTER — Telehealth: Payer: Self-pay

## 2018-06-29 ENCOUNTER — Inpatient Hospital Stay: Payer: Self-pay

## 2018-06-29 NOTE — Telephone Encounter (Signed)
Called patient to do their pre-visit COVID screening.  Unable to prescreen. Call went to voicemail.

## 2018-07-02 ENCOUNTER — Ambulatory Visit: Payer: Medicaid Other

## 2018-07-04 ENCOUNTER — Other Ambulatory Visit: Payer: Self-pay

## 2018-07-04 ENCOUNTER — Ambulatory Visit (INDEPENDENT_AMBULATORY_CARE_PROVIDER_SITE_OTHER): Payer: Medicaid Other

## 2018-07-04 DIAGNOSIS — Z3042 Encounter for surveillance of injectable contraceptive: Secondary | ICD-10-CM

## 2018-07-04 MED ORDER — MEDROXYPROGESTERONE ACETATE 150 MG/ML IM SUSP
150.0000 mg | Freq: Once | INTRAMUSCULAR | Status: AC
  Administered 2018-07-04: 150 mg via INTRAMUSCULAR

## 2018-07-04 NOTE — Progress Notes (Signed)
Patient here for depo shot. Still in window. Denies any side effects. Next shot is due between June 24-July 8. Administered shot in R arm. Patient tolerated well. KWalker, CMA.

## 2018-10-04 ENCOUNTER — Other Ambulatory Visit: Payer: Self-pay

## 2018-10-04 ENCOUNTER — Ambulatory Visit (INDEPENDENT_AMBULATORY_CARE_PROVIDER_SITE_OTHER): Payer: Medicaid Other

## 2018-10-04 ENCOUNTER — Ambulatory Visit: Payer: Medicaid Other | Admitting: Family Medicine

## 2018-10-04 DIAGNOSIS — Z3042 Encounter for surveillance of injectable contraceptive: Secondary | ICD-10-CM | POA: Diagnosis not present

## 2018-10-04 DIAGNOSIS — D500 Iron deficiency anemia secondary to blood loss (chronic): Secondary | ICD-10-CM

## 2018-10-04 MED ORDER — MEDROXYPROGESTERONE ACETATE 150 MG/ML IM SUSP
150.0000 mg | Freq: Once | INTRAMUSCULAR | Status: AC
  Administered 2018-10-04: 17:00:00 150 mg via INTRAMUSCULAR

## 2018-10-04 NOTE — Progress Notes (Signed)
Patient here for Depo-Provera injection. Is still in her window. Denies any side effects. Next shot due between 12/20/18-01/03/2019. Shot given in R deltoid & tolerated well by patient. KWalker, CMA.

## 2018-10-05 LAB — IRON,TIBC AND FERRITIN PANEL
Ferritin: 38 ng/mL (ref 15–150)
Iron Saturation: 11 % — ABNORMAL LOW (ref 15–55)
Iron: 40 ug/dL (ref 27–159)
Total Iron Binding Capacity: 362 ug/dL (ref 250–450)
UIBC: 322 ug/dL (ref 131–425)

## 2018-10-05 LAB — CBC WITH DIFFERENTIAL/PLATELET
Basophils Absolute: 0 10*3/uL (ref 0.0–0.2)
Basos: 0 %
EOS (ABSOLUTE): 0.1 10*3/uL (ref 0.0–0.4)
Eos: 2 %
Hematocrit: 37.3 % (ref 34.0–46.6)
Hemoglobin: 12.1 g/dL (ref 11.1–15.9)
Immature Grans (Abs): 0 10*3/uL (ref 0.0–0.1)
Immature Granulocytes: 0 %
Lymphocytes Absolute: 1.2 10*3/uL (ref 0.7–3.1)
Lymphs: 24 %
MCH: 23.9 pg — ABNORMAL LOW (ref 26.6–33.0)
MCHC: 32.4 g/dL (ref 31.5–35.7)
MCV: 74 fL — ABNORMAL LOW (ref 79–97)
Monocytes Absolute: 0.5 10*3/uL (ref 0.1–0.9)
Monocytes: 10 %
Neutrophils Absolute: 3.1 10*3/uL (ref 1.4–7.0)
Neutrophils: 64 %
Platelets: 277 10*3/uL (ref 150–450)
RBC: 5.07 x10E6/uL (ref 3.77–5.28)
RDW: 20.6 % — ABNORMAL HIGH (ref 11.7–15.4)
WBC: 4.9 10*3/uL (ref 3.4–10.8)

## 2018-10-08 NOTE — Progress Notes (Signed)
Patient notified of results & recommendations. Expressed understanding.

## 2018-12-20 ENCOUNTER — Ambulatory Visit: Payer: Medicaid Other

## 2018-12-24 ENCOUNTER — Ambulatory Visit: Payer: Medicaid Other

## 2018-12-25 ENCOUNTER — Ambulatory Visit (INDEPENDENT_AMBULATORY_CARE_PROVIDER_SITE_OTHER): Payer: Medicaid Other

## 2018-12-25 ENCOUNTER — Other Ambulatory Visit: Payer: Self-pay

## 2018-12-25 DIAGNOSIS — Z3042 Encounter for surveillance of injectable contraceptive: Secondary | ICD-10-CM | POA: Diagnosis not present

## 2018-12-25 LAB — POCT URINE PREGNANCY: Preg Test, Ur: NEGATIVE

## 2018-12-25 MED ORDER — MEDROXYPROGESTERONE ACETATE 150 MG/ML IM SUSP
150.0000 mg | Freq: Once | INTRAMUSCULAR | Status: AC
  Administered 2018-12-25: 150 mg via INTRAMUSCULAR

## 2018-12-25 NOTE — Progress Notes (Signed)
Patient here for Depo-Provera injection. Is still in window. Has concerns about pregnancy & requests a pregnancy test be ran. Pregnancy test was negative. Gave shot in R arm. Tolerated well. Next shot due between 03/12/2019-03/26/2019. KWalker, CMA.

## 2019-03-12 ENCOUNTER — Ambulatory Visit (INDEPENDENT_AMBULATORY_CARE_PROVIDER_SITE_OTHER): Payer: Medicaid Other

## 2019-03-12 ENCOUNTER — Other Ambulatory Visit: Payer: Self-pay

## 2019-03-12 DIAGNOSIS — Z3042 Encounter for surveillance of injectable contraceptive: Secondary | ICD-10-CM

## 2019-03-12 MED ORDER — MEDROXYPROGESTERONE ACETATE 150 MG/ML IM SUSP
150.0000 mg | Freq: Once | INTRAMUSCULAR | Status: AC
  Administered 2019-03-12: 150 mg via INTRAMUSCULAR

## 2019-03-12 NOTE — Progress Notes (Signed)
Patient here for Depo-Provera injection. Is in her window. Denies any side effects. Shot given in R deltoid & tolerated well. Next shot due between 05/28/2019-06/11/2019. KWalker, CMA.

## 2019-05-28 ENCOUNTER — Other Ambulatory Visit: Payer: Self-pay

## 2019-05-28 ENCOUNTER — Ambulatory Visit (INDEPENDENT_AMBULATORY_CARE_PROVIDER_SITE_OTHER): Payer: Self-pay

## 2019-05-28 DIAGNOSIS — Z3042 Encounter for surveillance of injectable contraceptive: Secondary | ICD-10-CM

## 2019-05-28 MED ORDER — MEDROXYPROGESTERONE ACETATE 150 MG/ML IM SUSP
150.0000 mg | Freq: Once | INTRAMUSCULAR | Status: AC
  Administered 2019-05-28: 150 mg via INTRAMUSCULAR

## 2019-08-15 ENCOUNTER — Other Ambulatory Visit: Payer: Self-pay

## 2019-08-15 ENCOUNTER — Encounter (HOSPITAL_COMMUNITY): Payer: Self-pay | Admitting: *Deleted

## 2019-08-15 ENCOUNTER — Emergency Department (HOSPITAL_COMMUNITY)
Admission: EM | Admit: 2019-08-15 | Discharge: 2019-08-16 | Disposition: A | Discharge status: Home / Self Care | Payer: Medicaid Other | Attending: Emergency Medicine | Admitting: Emergency Medicine

## 2019-08-15 DIAGNOSIS — Y92410 Unspecified street and highway as the place of occurrence of the external cause: Secondary | ICD-10-CM | POA: Insufficient documentation

## 2019-08-15 DIAGNOSIS — R519 Headache, unspecified: Secondary | ICD-10-CM | POA: Insufficient documentation

## 2019-08-15 DIAGNOSIS — Y9389 Activity, other specified: Secondary | ICD-10-CM | POA: Insufficient documentation

## 2019-08-15 DIAGNOSIS — M545 Low back pain: Secondary | ICD-10-CM | POA: Insufficient documentation

## 2019-08-15 DIAGNOSIS — Y999 Unspecified external cause status: Secondary | ICD-10-CM | POA: Insufficient documentation

## 2019-08-15 DIAGNOSIS — Z79899 Other long term (current) drug therapy: Secondary | ICD-10-CM | POA: Insufficient documentation

## 2019-08-15 MED ORDER — ACETAMINOPHEN 500 MG PO TABS
1000.0000 mg | ORAL_TABLET | Freq: Once | ORAL | Status: AC
  Administered 2019-08-16: 1000 mg via ORAL
  Filled 2019-08-15: qty 2
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
## 2019-08-16 LAB — I-STAT BETA HCG BLOOD, ED (MC, WL, AP ONLY): I-stat hCG, quantitative: <5 m[IU]/mL (ref <5)
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
## 2019-08-16 NOTE — Discharge Instructions (Addendum)
Please take Tylenol and/or Motrin for pain.  You may also apply ice packs to the areas of pain.  You will be sore for the next several days.  It is important that you return to the emergency department if you have new or worsening symptoms, specifically if you have numbness, tingling, weakness or severe pain.

## 2019-08-23 ENCOUNTER — Telehealth: Payer: Self-pay

## 2019-08-23 NOTE — Telephone Encounter (Signed)
Called patient to do their pre-visit COVID screening.  Call went to voicemail, which is full. Unable to do prescreening.  

## 2019-08-27 ENCOUNTER — Ambulatory Visit: Payer: Medicaid Other | Admitting: Internal Medicine

## 2019-08-28 ENCOUNTER — Ambulatory Visit: Payer: Medicaid Other | Admitting: Internal Medicine

## 2019-09-20 ENCOUNTER — Telehealth: Payer: Self-pay

## 2019-09-20 NOTE — Telephone Encounter (Signed)
Called patient to do their pre-visit COVID screening.  Call went to voicemail, which is full. Unable to do prescreening.  

## 2019-09-23 ENCOUNTER — Ambulatory Visit: Payer: Medicaid Other | Admitting: Internal Medicine

## 2019-10-08 ENCOUNTER — Ambulatory Visit: Payer: Medicaid Other

## 2019-11-22 ENCOUNTER — Emergency Department (HOSPITAL_COMMUNITY)
Admission: EM | Admit: 2019-11-22 | Discharge: 2019-11-23 | Disposition: A | Discharge status: Home / Self Care | Payer: Medicaid Other | Attending: Emergency Medicine | Admitting: Emergency Medicine

## 2019-11-22 ENCOUNTER — Other Ambulatory Visit: Payer: Self-pay

## 2019-11-22 DIAGNOSIS — Y990 Civilian activity done for income or pay: Secondary | ICD-10-CM | POA: Insufficient documentation

## 2019-11-22 DIAGNOSIS — Z5321 Procedure and treatment not carried out due to patient leaving prior to being seen by health care provider: Secondary | ICD-10-CM | POA: Insufficient documentation

## 2019-11-22 DIAGNOSIS — Y9269 Other specified industrial and construction area as the place of occurrence of the external cause: Secondary | ICD-10-CM | POA: Insufficient documentation

## 2019-11-22 DIAGNOSIS — Y9389 Activity, other specified: Secondary | ICD-10-CM | POA: Insufficient documentation

## 2019-11-22 DIAGNOSIS — W268XXA Contact with other sharp object(s), not elsewhere classified, initial encounter: Secondary | ICD-10-CM | POA: Insufficient documentation

## 2019-11-22 DIAGNOSIS — S61412A Laceration without foreign body of left hand, initial encounter: Secondary | ICD-10-CM | POA: Insufficient documentation

## 2019-11-23 ENCOUNTER — Encounter (HOSPITAL_COMMUNITY): Payer: Self-pay | Admitting: Emergency Medicine

## 2019-11-23 ENCOUNTER — Other Ambulatory Visit: Payer: Self-pay

## 2019-11-23 ENCOUNTER — Emergency Department (HOSPITAL_COMMUNITY)
Admission: EM | Admit: 2019-11-23 | Discharge: 2019-11-23 | Disposition: A | Discharge status: Home / Self Care | Payer: Medicaid Other | Attending: Emergency Medicine | Admitting: Emergency Medicine

## 2019-11-23 DIAGNOSIS — Y999 Unspecified external cause status: Secondary | ICD-10-CM | POA: Insufficient documentation

## 2019-11-23 DIAGNOSIS — Z23 Encounter for immunization: Secondary | ICD-10-CM | POA: Insufficient documentation

## 2019-11-23 DIAGNOSIS — Y939 Activity, unspecified: Secondary | ICD-10-CM | POA: Insufficient documentation

## 2019-11-23 DIAGNOSIS — W268XXA Contact with other sharp object(s), not elsewhere classified, initial encounter: Secondary | ICD-10-CM | POA: Insufficient documentation

## 2019-11-23 DIAGNOSIS — S61412A Laceration without foreign body of left hand, initial encounter: Secondary | ICD-10-CM

## 2019-11-23 DIAGNOSIS — Y929 Unspecified place or not applicable: Secondary | ICD-10-CM | POA: Insufficient documentation

## 2019-11-23 DIAGNOSIS — S61422A Laceration with foreign body of left hand, initial encounter: Secondary | ICD-10-CM | POA: Insufficient documentation

## 2019-11-23 MED ORDER — TETANUS-DIPHTH-ACELL PERTUSSIS 5-2.5-18.5 LF-MCG/0.5 IM SUSP
0.5000 mL | Freq: Once | INTRAMUSCULAR | Status: AC
  Administered 2019-11-23: 0.5 mL via INTRAMUSCULAR
  Filled 2019-11-23: qty 0.5

## 2019-11-23 NOTE — ED Provider Notes (Signed)
[facility] EMERGENCY DEPARTMENT Provider Note   CSN: [id number] Arrival date & time: 11/23/19  1728     History Chief Complaint  Patient presents with  . Extremity Laceration    Linda Johnston is a 19 y.o. female.  HPI      Linda Johnston is a 19 y.o. female, with a history of anemia, presenting to the ED with laceration to left dorsal hand that occurred last night. 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 States she came to the ED last night, but left without being seen due to the wait. Unknown last tetanus vaccination update. States she is mainly here because she needs a note to return to work. Denies numbness, weakness, other injuries.  History reviewed. No pertinent past medical history.  Patient Active Problem List   Diagnosis Date Noted  . Iron deficiency anemia due to chronic blood loss 01/30/2018  . Menorrhagia 01/30/2018  . Anemia 01/11/2018  . Seizure ([place]) 01/06/2013  . Headache 01/06/2013  . Emesis 01/06/2013  . 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 No pertinent surgical history.   OB History   No obstetric history on file.     No family history on file.  Social History   Tobacco Use  . Smoking status: Never Smoker  . Smokeless tobacco: Never Used  . Tobacco comment: Per Guardian-no one smokes around patient  Vaping Use  . Vaping Use: Never used  Substance Use Topics  . Alcohol use: No  . Drug use: Not Currently    Home Medications Prior to Admission medications   Medication Sig Start Date End Date Taking? Authorizing Provider  ibuprofen (ADVIL) 200 MG tablet Take 800 mg by mouth daily as needed for fever or headache.    [provider]  medroxyPROGESTERone (DEPO-PROVERA) 150 MG/ML injection Inject 150 mg into the muscle every 3 (three) months.    [provider]  triamcinolone cream (KENALOG) 0.1 % Apply 1 application topically 2 (two) times daily. Patient not  taking: Reported on 08/15/2019 05/09/18   Scot Jun, FNP    Allergies    Patient has no known allergies.  Review of Systems   Review of Systems  Skin: Positive for wound.  Neurological: Negative for weakness and numbness.    Physical Exam Updated Vital Signs BP 118/73 (BP Location: Left Arm)   Pulse 75   Temp 98.2 F (36.8 C) (Oral)   Resp 16   SpO2 100%   Physical Exam Vitals and nursing note reviewed.  Constitutional:      General: She is not in acute distress.    Appearance: She is well-developed. She is not diaphoretic.  HENT:     Head: Normocephalic and atraumatic.  Eyes:     Conjunctiva/sclera: Conjunctivae normal.  Cardiovascular:     Rate and Rhythm: Normal rate and regular rhythm.     Pulses:          Radial pulses are 2+ on the left side.  Pulmonary:     Effort: Pulmonary effort is normal.  Musculoskeletal:     Cervical back: Neck supple.     Comments: 2 cm superficial appearing laceration to the left dorsal thumb. Full range of motion in each of the joints of thumb. Full range of motion in the left wrist without pain or noted difficulty.  Skin:    General: Skin is warm and dry.     Capillary Refill: Capillary refill takes less than 2  seconds.     Coloration: Skin is not pale.  Neurological:     Mental Status: She is alert.     Comments: Sensation to light touch grossly intact in the left thumb and hand. Movement against resistance intact in the cardinal directions of the left thumb. Sensation grossly intact to light touch through each of the nerve distributions of the bilateral upper extremities. Abduction and adduction of the fingers intact against resistance. Grip strength equal bilaterally. Strength 5/5 through the cardinal directions of the bilateral wrists. Patient can touch the thumb to each one of the fingertips without difficulty.  Patient can hold the "OK" sign against resistance.  Psychiatric:        Behavior: Behavior normal.      ED Results / Procedures / Treatments   Labs (all labs ordered are listed, but only abnormal results are displayed) Labs Reviewed - No data to display  EKG None  Radiology No results found.  Procedures Procedures (including critical care time)  Medications Ordered in ED Medications  Tdap (BOOSTRIX) injection 0.5 mL (0.5 mLs Intramuscular Given 11/23/19 2234)    ED Course  I have reviewed the triage vital signs and the nursing notes.  Pertinent labs & imaging results that were available during my care of the patient were reviewed by me and considered in my medical decision making (see chart for details).    MDM Rules/Calculators/A&P                          Patient presents with laceration to left dorsal hand.  Appears to be superficial.  Laceration has been in place for about 24 hours. No evidence of neurovascular compromise. The patient was given instructions for home care as well as return precautions. Patient voices understanding of these instructions, accepts the plan, and is comfortable with discharge.   Final Clinical Impression(s) / ED Diagnoses Final diagnoses:  Laceration of left hand, foreign body presence unspecified, initial encounter    Rx / DC Orders ED Discharge Orders    None       Layla Maw 11/23/19 2259    Valarie Merino, MD 11/26/19 [phone]

## 2019-11-23 NOTE — ED Triage Notes (Signed)
Pt reports while at work this evening she cut L hand with unkn object while moving boxes, bleeding controlled.

## 2019-11-23 NOTE — ED Triage Notes (Signed)
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  Pt came last night and LWBS.  States she needs a note to go back to work.  Bleeding controlled.

## 2019-11-23 NOTE — ED Notes (Signed)
Pt did not answer x4 for room call

## 2019-11-23 NOTE — Discharge Instructions (Signed)
  Wound Care - General Wound Cleaning: Clean the wound and surrounding area gently with tap water and mild soap. Rinse well and blot dry. Do not scrub the wound, as this may cause the wound edges to come apart. You may shower, but avoid submerging the wound, such as with a bath or swimming.  Clean the wound daily to prevent infection.  Do not use cleaners such as hydrogen peroxide or alcohol.   Scar reduction: Application of a topical antibiotic ointment, such as Neosporin, after the wound has begun to close and heal well can decrease scab formation and reduce scarring. After the wound has healed, application of ointments such as Aquaphor can also reduce scar formation.  The key to scar reduction is keeping the skin well hydrated and supple. Drinking plenty of water throughout the day (At least eight 8oz glasses of water a day) is essential to staying well hydrated.  Sun exposure: Keep the wound out of the sun. After the wound has healed, continue to protect it from the sun by wearing protective clothing or applying sunscreen.  Pain: You may use Tylenol, naproxen, or ibuprofen for pain. Antiinflammatory medications: Take 600 mg of ibuprofen every 6 hours or 440 mg (over the counter dose) to 500 mg (prescription dose) of naproxen every 12 hours for the next 3 days. After this time, these medications may be used as needed for pain. Take these medications with food to avoid upset stomach. Choose only one of these medications, do not take them together. Acetaminophen (generic for Tylenol): Should you continue to have additional pain while taking the ibuprofen or naproxen, you may add in acetaminophen as needed. Your daily total maximum amount of acetaminophen from all sources should be limited to 4000mg/day for persons without liver problems, or 2000mg/day for those with liver problems.  Return: Return to the ED should signs of infection arise, such as spreading redness, puffiness/swelling, pus draining  from the wound, severe increase in pain, fever over 100.3F, or any other major issues.  For prescription assistance, may try using prescription discount sites or apps, such as goodrx.com 

## 2020-12-14 DIAGNOSIS — Z23 Encounter for immunization: Secondary | ICD-10-CM | POA: Diagnosis not present

## 2022-02-25 DIAGNOSIS — Z419 Encounter for procedure for purposes other than remedying health state, unspecified: Secondary | ICD-10-CM | POA: Diagnosis not present

## 2022-03-28 DIAGNOSIS — Z419 Encounter for procedure for purposes other than remedying health state, unspecified: Secondary | ICD-10-CM | POA: Diagnosis not present

## 2022-04-28 DIAGNOSIS — Z419 Encounter for procedure for purposes other than remedying health state, unspecified: Secondary | ICD-10-CM | POA: Diagnosis not present

## 2022-05-27 DIAGNOSIS — Z419 Encounter for procedure for purposes other than remedying health state, unspecified: Secondary | ICD-10-CM | POA: Diagnosis not present

## 2022-06-27 DIAGNOSIS — Z419 Encounter for procedure for purposes other than remedying health state, unspecified: Secondary | ICD-10-CM | POA: Diagnosis not present

## 2022-07-27 DIAGNOSIS — Z419 Encounter for procedure for purposes other than remedying health state, unspecified: Secondary | ICD-10-CM | POA: Diagnosis not present

## 2022-08-27 DIAGNOSIS — Z419 Encounter for procedure for purposes other than remedying health state, unspecified: Secondary | ICD-10-CM | POA: Diagnosis not present

## 2022-09-20 DIAGNOSIS — Z862 Personal history of diseases of the blood and blood-forming organs and certain disorders involving the immune mechanism: Secondary | ICD-10-CM | POA: Diagnosis not present

## 2022-09-20 DIAGNOSIS — I493 Ventricular premature depolarization: Secondary | ICD-10-CM | POA: Diagnosis not present

## 2022-09-20 DIAGNOSIS — Z789 Other specified health status: Secondary | ICD-10-CM | POA: Diagnosis not present

## 2022-09-20 DIAGNOSIS — Z0000 Encounter for general adult medical examination without abnormal findings: Secondary | ICD-10-CM | POA: Diagnosis not present

## 2022-09-20 DIAGNOSIS — Z131 Encounter for screening for diabetes mellitus: Secondary | ICD-10-CM | POA: Diagnosis not present

## 2022-09-20 DIAGNOSIS — Z6826 Body mass index (BMI) 26.0-26.9, adult: Secondary | ICD-10-CM | POA: Diagnosis not present

## 2022-09-20 DIAGNOSIS — Z01411 Encounter for gynecological examination (general) (routine) with abnormal findings: Secondary | ICD-10-CM | POA: Diagnosis not present

## 2022-09-20 DIAGNOSIS — Z13220 Encounter for screening for lipoid disorders: Secondary | ICD-10-CM | POA: Diagnosis not present

## 2022-09-26 DIAGNOSIS — Z419 Encounter for procedure for purposes other than remedying health state, unspecified: Secondary | ICD-10-CM | POA: Diagnosis not present

## 2022-10-27 DIAGNOSIS — Z419 Encounter for procedure for purposes other than remedying health state, unspecified: Secondary | ICD-10-CM | POA: Diagnosis not present

## 2022-11-27 DIAGNOSIS — Z419 Encounter for procedure for purposes other than remedying health state, unspecified: Secondary | ICD-10-CM | POA: Diagnosis not present

## 2022-12-27 DIAGNOSIS — Z419 Encounter for procedure for purposes other than remedying health state, unspecified: Secondary | ICD-10-CM | POA: Diagnosis not present

## 2023-01-27 DIAGNOSIS — Z419 Encounter for procedure for purposes other than remedying health state, unspecified: Secondary | ICD-10-CM | POA: Diagnosis not present

## 2023-02-26 DIAGNOSIS — Z419 Encounter for procedure for purposes other than remedying health state, unspecified: Secondary | ICD-10-CM | POA: Diagnosis not present

## 2023-03-29 DIAGNOSIS — Z419 Encounter for procedure for purposes other than remedying health state, unspecified: Secondary | ICD-10-CM | POA: Diagnosis not present

## 2023-04-29 DIAGNOSIS — Z419 Encounter for procedure for purposes other than remedying health state, unspecified: Secondary | ICD-10-CM | POA: Diagnosis not present

## 2023-05-27 DIAGNOSIS — Z419 Encounter for procedure for purposes other than remedying health state, unspecified: Secondary | ICD-10-CM | POA: Diagnosis not present

## 2023-07-08 DIAGNOSIS — Z419 Encounter for procedure for purposes other than remedying health state, unspecified: Secondary | ICD-10-CM | POA: Diagnosis not present

## 2023-08-07 DIAGNOSIS — Z419 Encounter for procedure for purposes other than remedying health state, unspecified: Secondary | ICD-10-CM | POA: Diagnosis not present

## 2023-09-07 DIAGNOSIS — Z419 Encounter for procedure for purposes other than remedying health state, unspecified: Secondary | ICD-10-CM | POA: Diagnosis not present

## 2023-10-07 DIAGNOSIS — Z419 Encounter for procedure for purposes other than remedying health state, unspecified: Secondary | ICD-10-CM | POA: Diagnosis not present

## 2023-11-07 DIAGNOSIS — Z419 Encounter for procedure for purposes other than remedying health state, unspecified: Secondary | ICD-10-CM | POA: Diagnosis not present

## 2023-12-08 DIAGNOSIS — Z419 Encounter for procedure for purposes other than remedying health state, unspecified: Secondary | ICD-10-CM | POA: Diagnosis not present

## 2024-02-07 DIAGNOSIS — Z419 Encounter for procedure for purposes other than remedying health state, unspecified: Secondary | ICD-10-CM | POA: Diagnosis not present

## 2024-03-08 DIAGNOSIS — Z419 Encounter for procedure for purposes other than remedying health state, unspecified: Secondary | ICD-10-CM | POA: Diagnosis not present
